# Patient Record
Sex: Female | Born: 1975 | Race: Black or African American | Hispanic: No | Marital: Single | Smoking: Never smoker
Health system: Southern US, Community
[De-identification: ages and names within clinical notes are randomized; demographics above are authoritative.]

## PROBLEM LIST (undated history)

## (undated) DIAGNOSIS — Z789 Other specified health status: Secondary | ICD-10-CM

## (undated) HISTORY — DX: Other specified health status: Z78.9

## (undated) HISTORY — PX: NO PAST SURGERIES: SHX2092

---

## 2005-08-26 ENCOUNTER — Emergency Department (HOSPITAL_COMMUNITY): Admission: EM | Admit: 2005-08-26 | Discharge: 2005-08-26 | Payer: Self-pay | Admitting: Emergency Medicine

## 2005-09-05 ENCOUNTER — Emergency Department (HOSPITAL_COMMUNITY): Admission: EM | Admit: 2005-09-05 | Discharge: 2005-09-05 | Payer: Self-pay | Admitting: Family Medicine

## 2005-09-06 ENCOUNTER — Emergency Department (HOSPITAL_COMMUNITY): Admission: EM | Admit: 2005-09-06 | Discharge: 2005-09-06 | Payer: Self-pay | Admitting: Family Medicine

## 2010-04-11 ENCOUNTER — Emergency Department (HOSPITAL_COMMUNITY): Admission: EM | Admit: 2010-04-11 | Discharge: 2010-04-11 | Payer: Self-pay | Admitting: Emergency Medicine

## 2011-06-09 ENCOUNTER — Emergency Department (HOSPITAL_COMMUNITY)
Admission: EM | Admit: 2011-06-09 | Discharge: 2011-06-10 | Disposition: A | Discharge status: Home / Self Care | Payer: Commercial Managed Care - PPO | Attending: Emergency Medicine | Admitting: Emergency Medicine

## 2011-06-09 DIAGNOSIS — O99891 Other specified diseases and conditions complicating pregnancy: Secondary | ICD-10-CM | POA: Insufficient documentation

## 2011-06-09 DIAGNOSIS — J40 Bronchitis, not specified as acute or chronic: Secondary | ICD-10-CM | POA: Insufficient documentation

## 2011-06-09 DIAGNOSIS — B9789 Other viral agents as the cause of diseases classified elsewhere: Secondary | ICD-10-CM | POA: Insufficient documentation

## 2011-06-09 DIAGNOSIS — O210 Mild hyperemesis gravidarum: Secondary | ICD-10-CM | POA: Insufficient documentation

## 2011-06-09 DIAGNOSIS — R070 Pain in throat: Secondary | ICD-10-CM | POA: Insufficient documentation

## 2011-06-09 DIAGNOSIS — J3489 Other specified disorders of nose and nasal sinuses: Secondary | ICD-10-CM | POA: Insufficient documentation

## 2011-06-10 ENCOUNTER — Emergency Department (HOSPITAL_COMMUNITY): Payer: Commercial Managed Care - PPO

## 2011-06-10 ENCOUNTER — Encounter (HOSPITAL_COMMUNITY): Payer: Self-pay | Admitting: Radiology

## 2011-06-10 LAB — URINALYSIS, ROUTINE W REFLEX MICROSCOPIC
Bilirubin Urine: NEGATIVE
Glucose, UA: 250 mg/dL — AB
Ketones, ur: NEGATIVE mg/dL
Protein, ur: NEGATIVE mg/dL

## 2011-06-10 LAB — RAPID STREP SCREEN (MED CTR MEBANE ONLY): Streptococcus, Group A Screen (Direct): NEGATIVE

## 2011-06-10 LAB — URINE MICROSCOPIC-ADD ON

## 2011-06-10 IMAGING — CR DG CHEST 2V
2 series · 2 of 2 positions shown · non-contrast
Comparison: None.

CLINICAL DATA: Cough for few days.  5 months pregnant.  Double
shielded.

CHEST - 2 VIEW

[w chest pa]
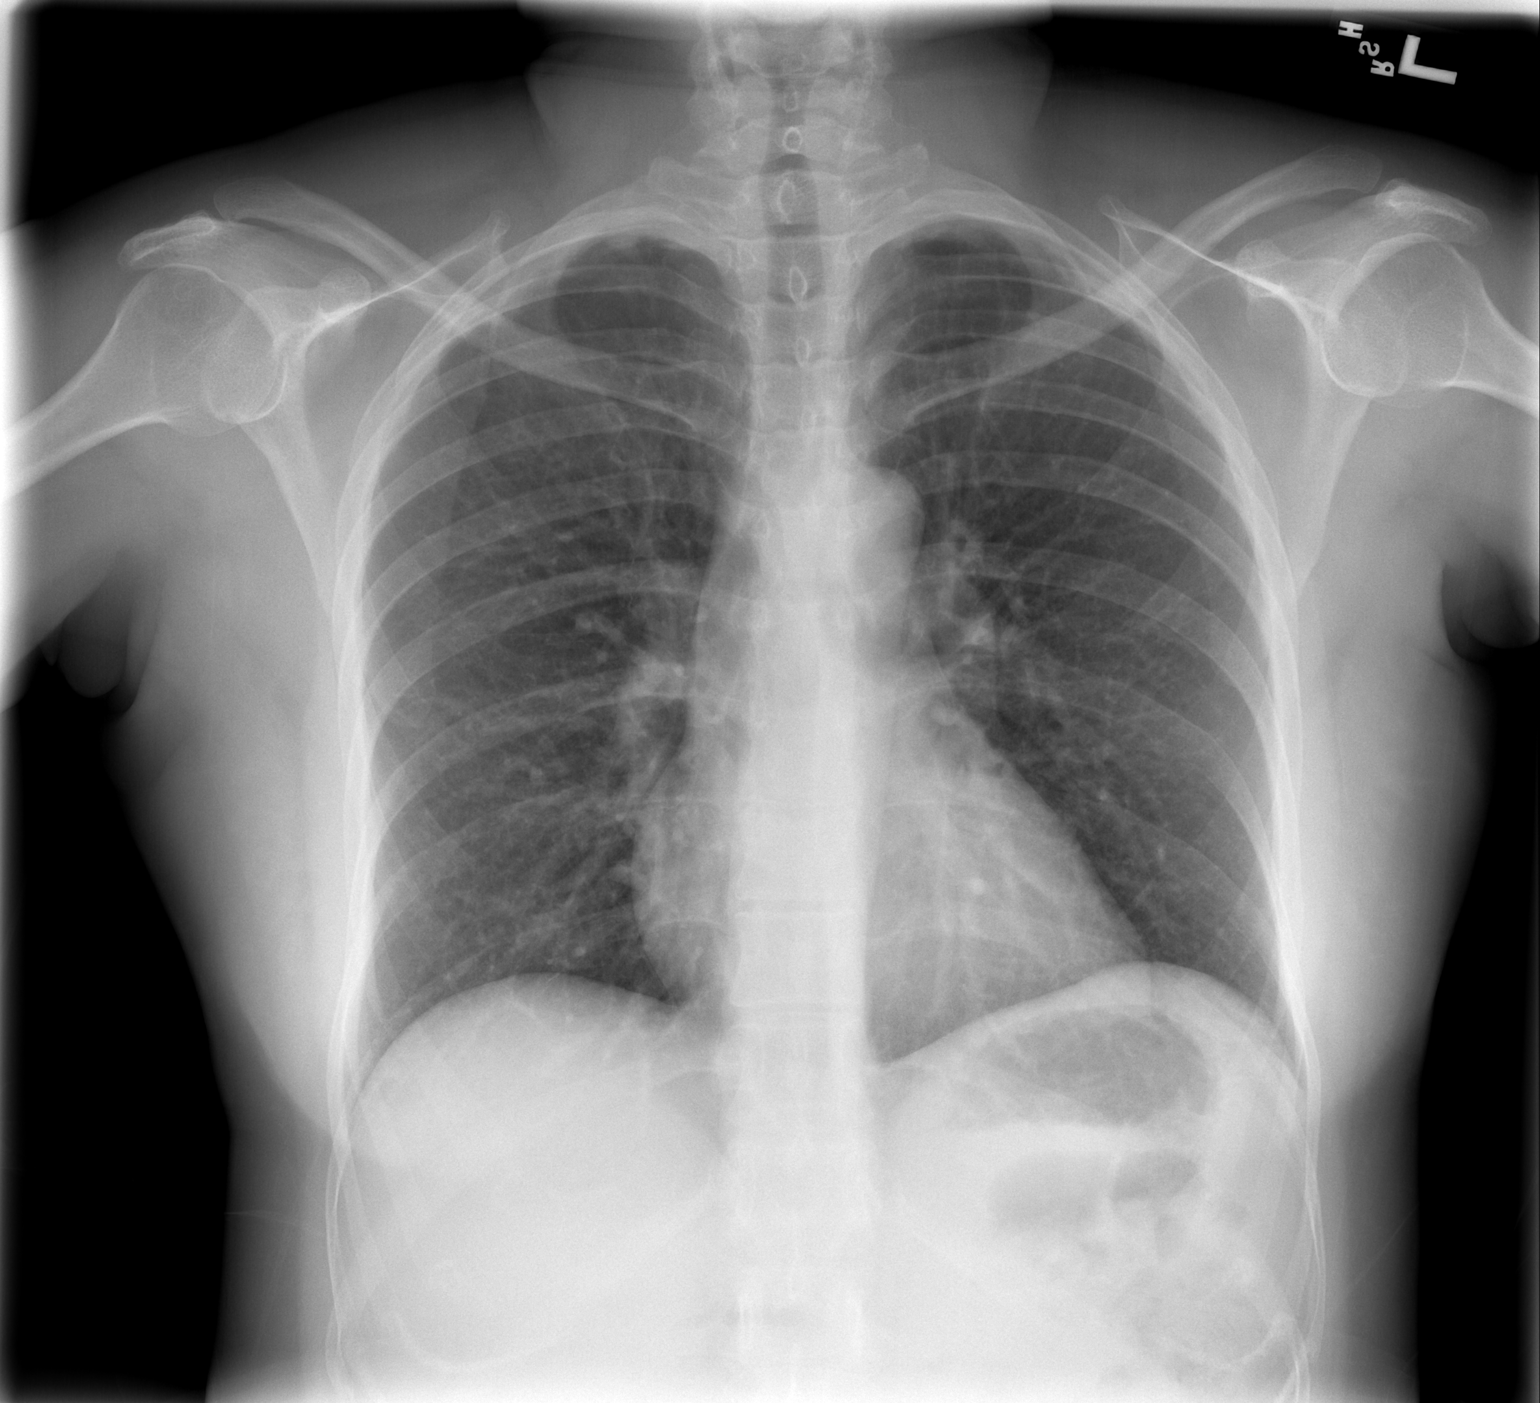

[w chest lat]
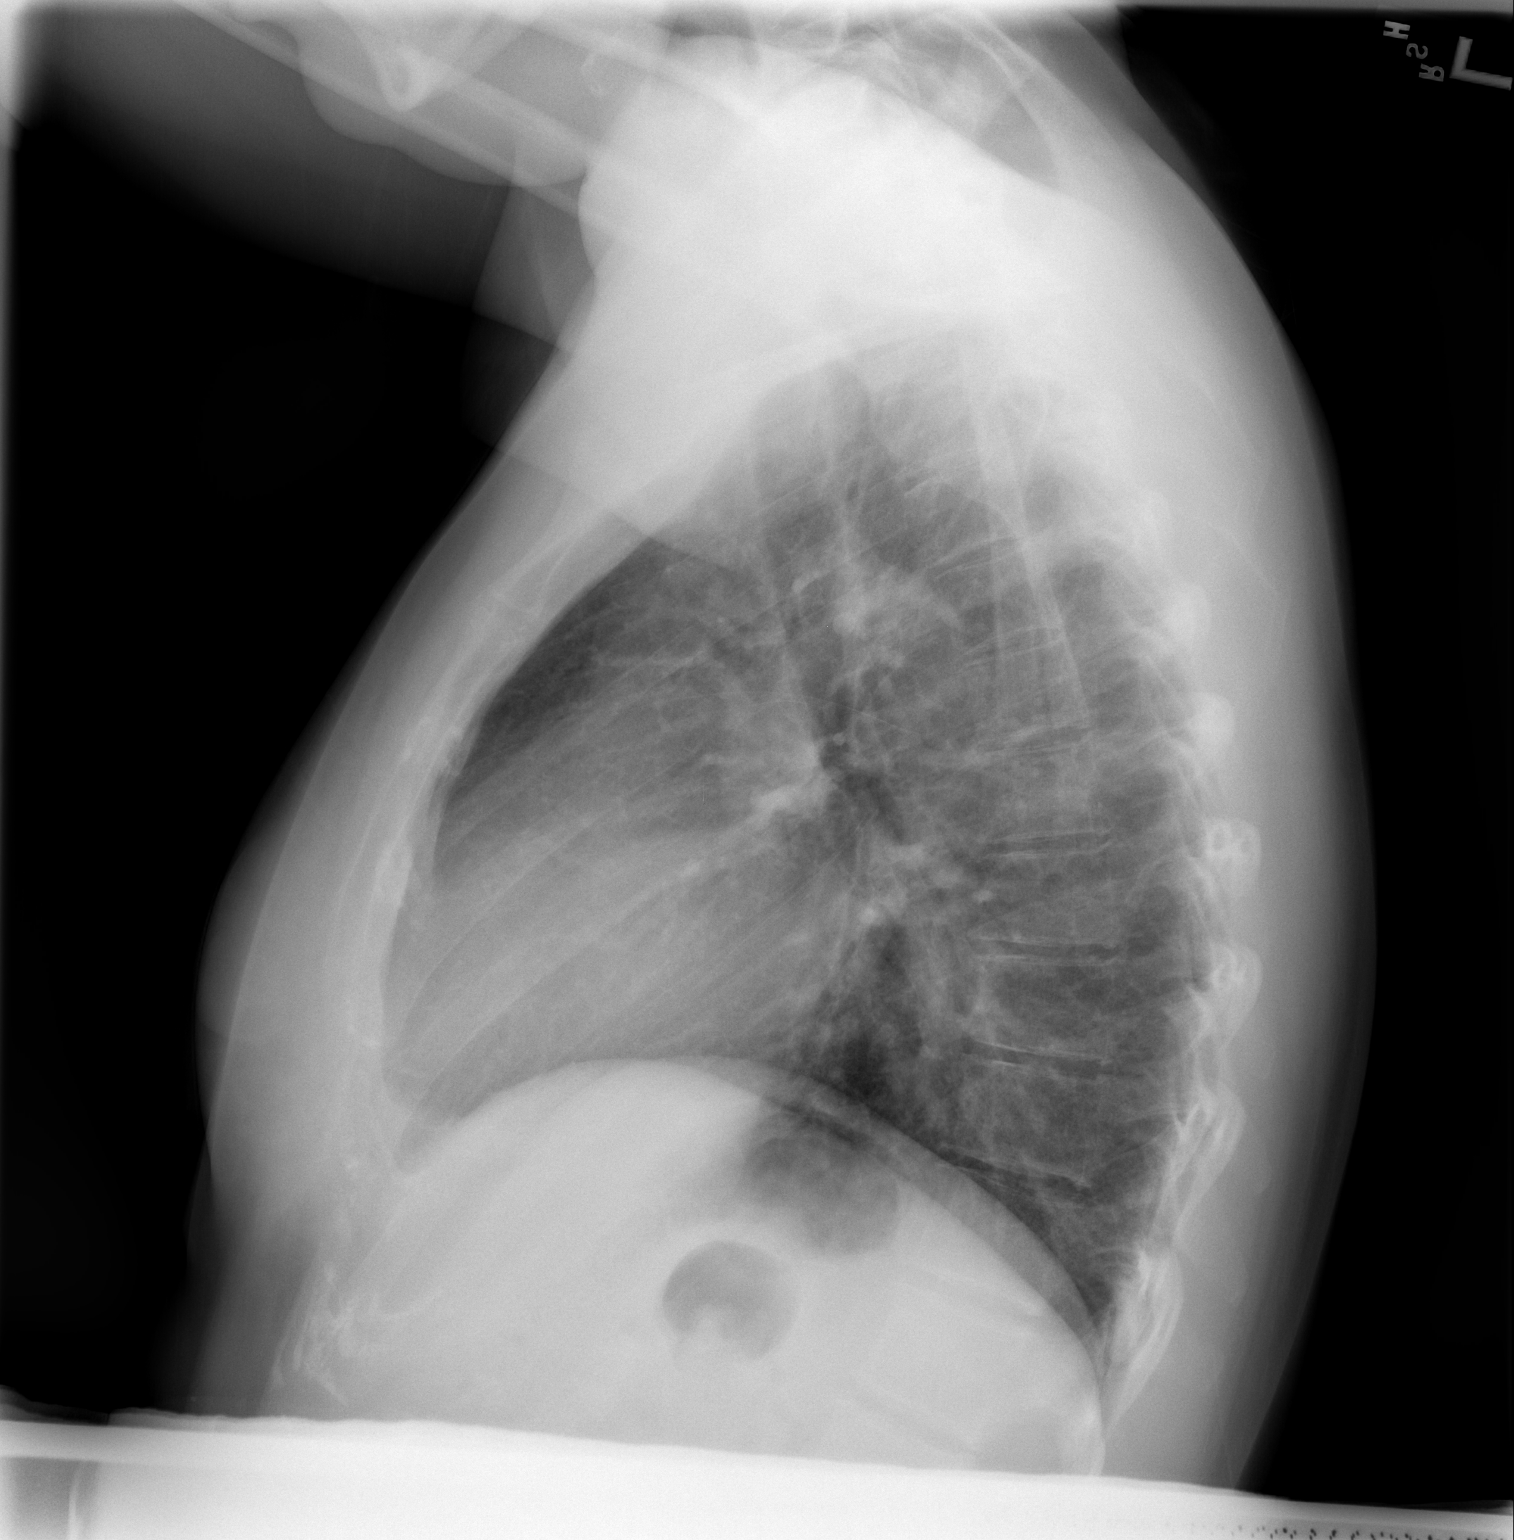

[2 of 2 positions shown; findings below may reference images not displayed]

FINDINGS: The heart size and pulmonary vascularity are normal. The
lungs appear clear and expanded without focal air space disease or
consolidation. No blunting of the costophrenic angles.
IMPRESSION: No evidence of active pulmonary disease.

## 2011-09-05 ENCOUNTER — Ambulatory Visit (INDEPENDENT_AMBULATORY_CARE_PROVIDER_SITE_OTHER): Payer: Medicaid Other | Admitting: *Deleted

## 2011-09-05 ENCOUNTER — Ambulatory Visit (HOSPITAL_COMMUNITY)
Admission: RE | Admit: 2011-09-05 | Discharge: 2011-09-05 | Disposition: A | Discharge status: Home / Self Care | Payer: Medicaid Other | Source: Ambulatory Visit | Attending: Obstetrics & Gynecology | Admitting: Obstetrics & Gynecology

## 2011-09-05 ENCOUNTER — Other Ambulatory Visit: Payer: Medicaid Other

## 2011-09-05 VITALS — BP 117/73 | Temp 97.1°F | Ht 64.0 in | Wt 155.9 lb

## 2011-09-05 DIAGNOSIS — Z1389 Encounter for screening for other disorder: Secondary | ICD-10-CM | POA: Insufficient documentation

## 2011-09-05 DIAGNOSIS — O09529 Supervision of elderly multigravida, unspecified trimester: Secondary | ICD-10-CM

## 2011-09-05 DIAGNOSIS — Z363 Encounter for antenatal screening for malformations: Secondary | ICD-10-CM | POA: Insufficient documentation

## 2011-09-05 DIAGNOSIS — Z3490 Encounter for supervision of normal pregnancy, unspecified, unspecified trimester: Secondary | ICD-10-CM

## 2011-09-05 DIAGNOSIS — Z3480 Encounter for supervision of other normal pregnancy, unspecified trimester: Secondary | ICD-10-CM

## 2011-09-05 DIAGNOSIS — O358XX0 Maternal care for other (suspected) fetal abnormality and damage, not applicable or unspecified: Secondary | ICD-10-CM | POA: Insufficient documentation

## 2011-09-05 DIAGNOSIS — O0930 Supervision of pregnancy with insufficient antenatal care, unspecified trimester: Secondary | ICD-10-CM

## 2011-09-05 DIAGNOSIS — IMO0002 Reserved for concepts with insufficient information to code with codable children: Secondary | ICD-10-CM | POA: Insufficient documentation

## 2011-09-05 DIAGNOSIS — Z23 Encounter for immunization: Secondary | ICD-10-CM

## 2011-09-05 IMAGING — US US OB DETAIL+14 WK
1 series · 12 of 28 positions shown · non-contrast
Comparison: none

[Series 1: us ob detail +14 wk · 12 of 53 slices shown]
[im 2/53]
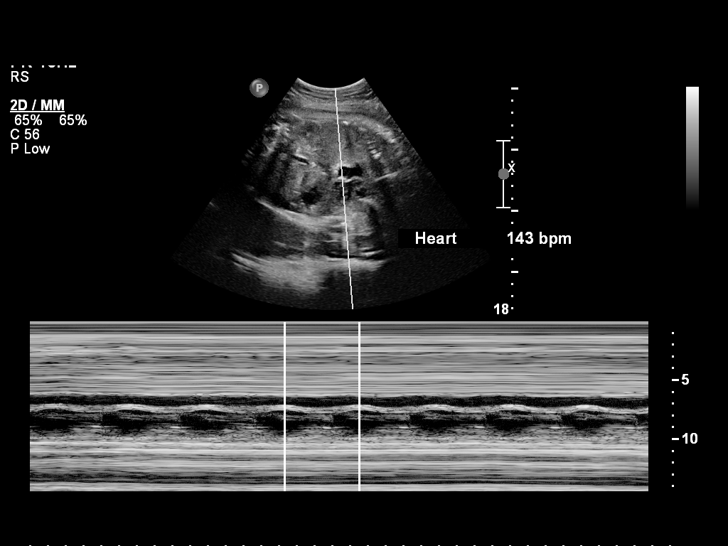
[im 6/53]
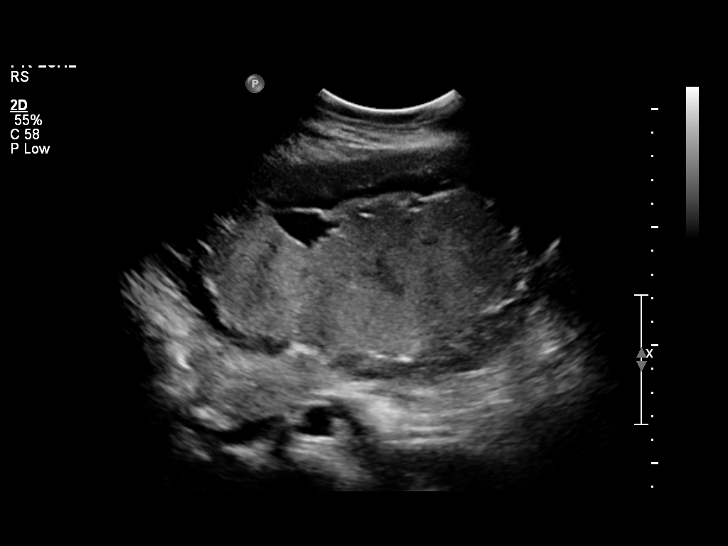
[im 10/53]
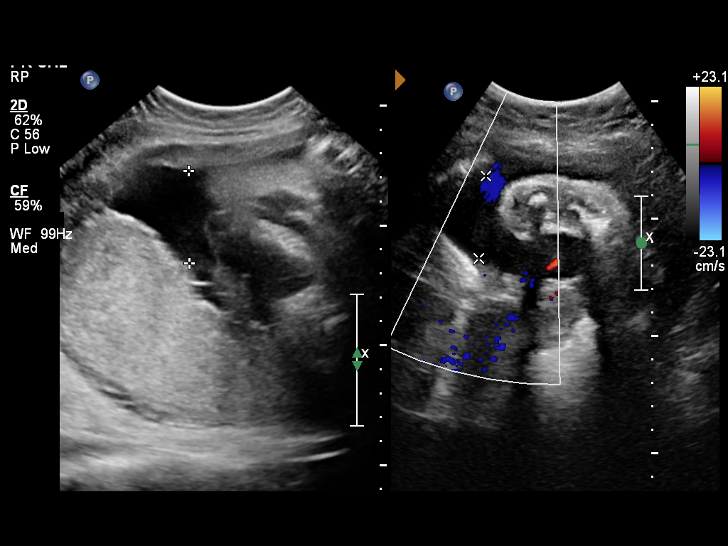
[im 16/53]
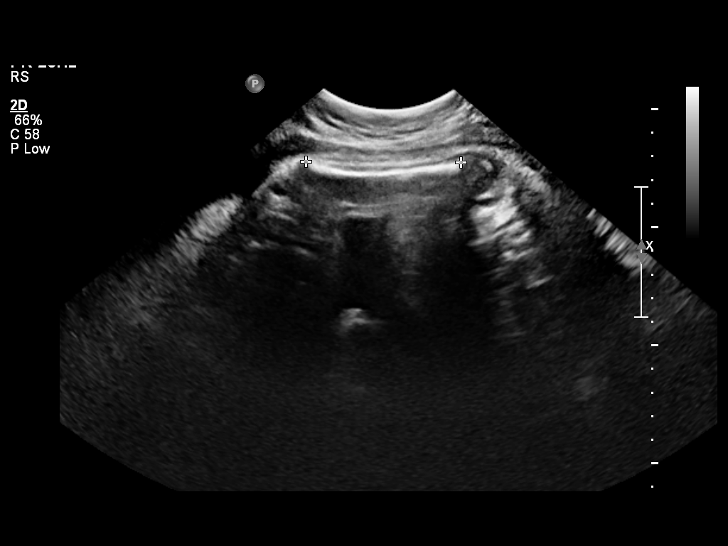
[im 20/53]
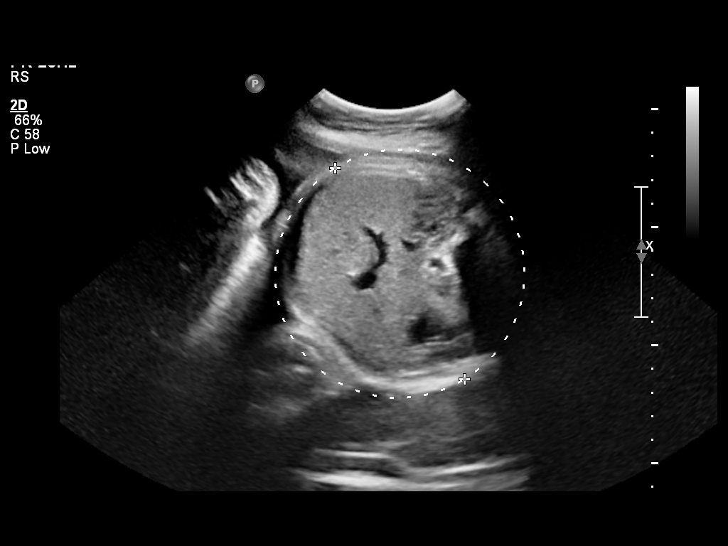
[im 24/53]
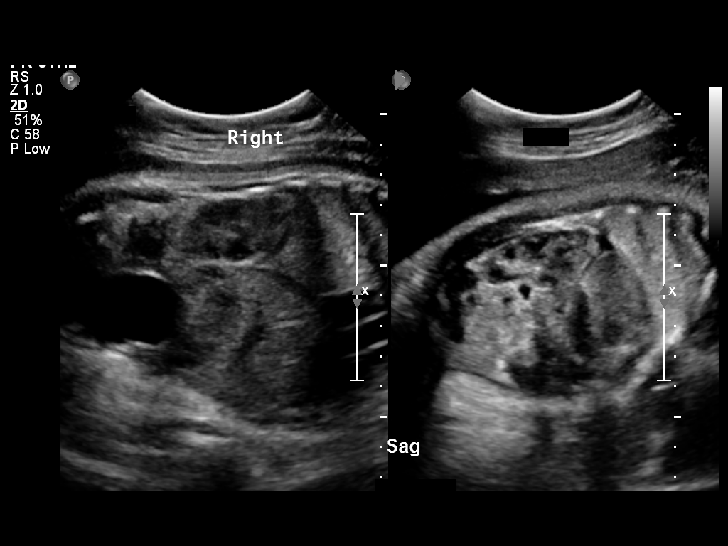
[im 29/53]
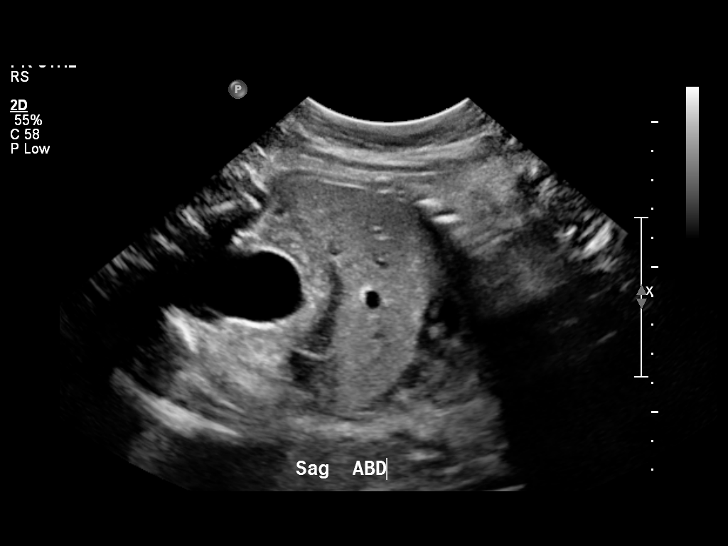
[im 33/53]
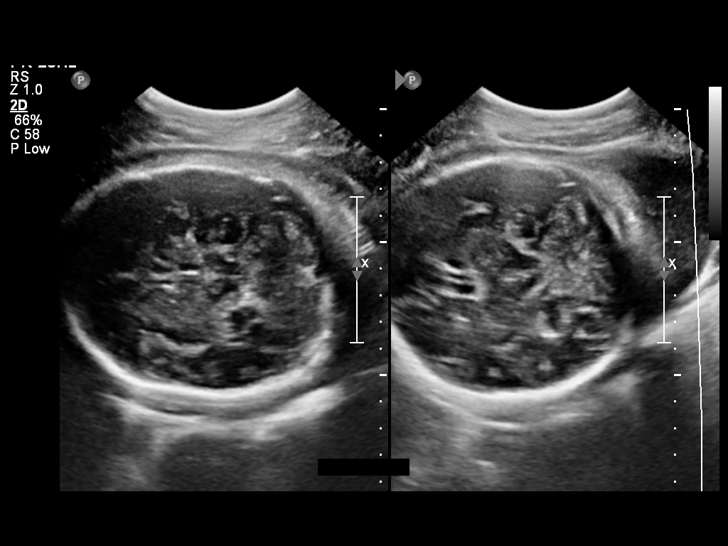
[im 37/53]
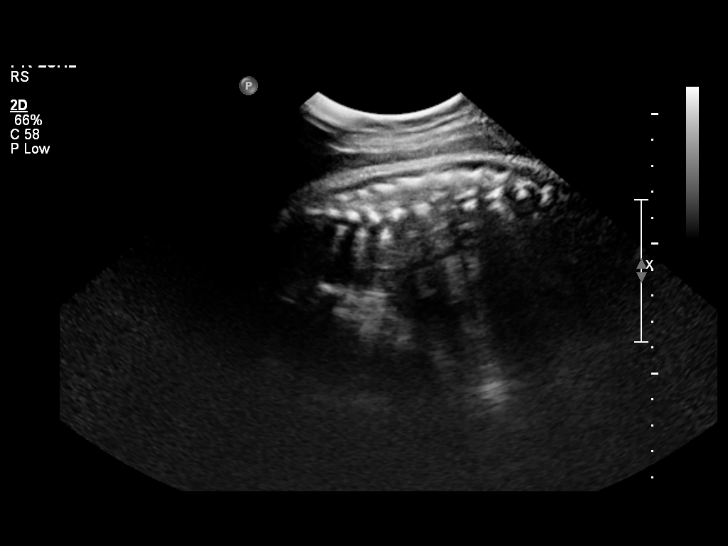
[im 43/53]
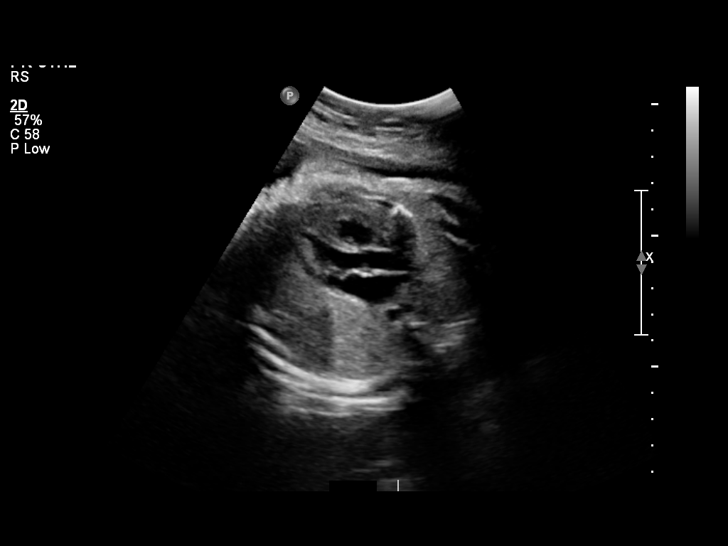
[im 47/53]
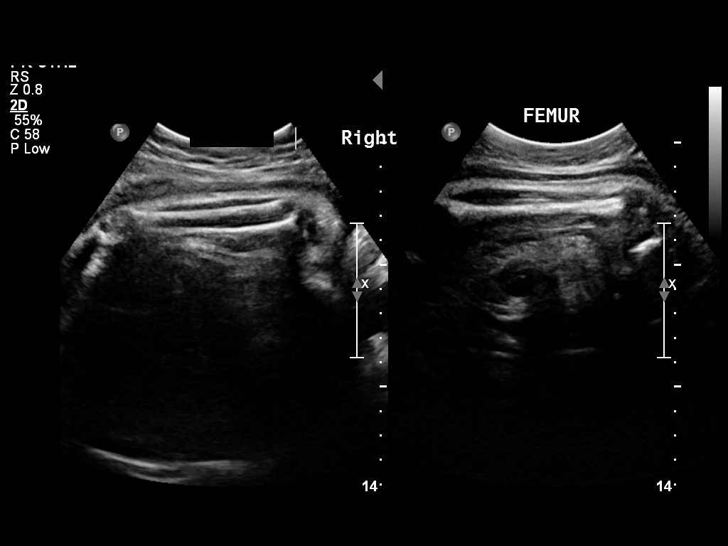
[im 51/53]
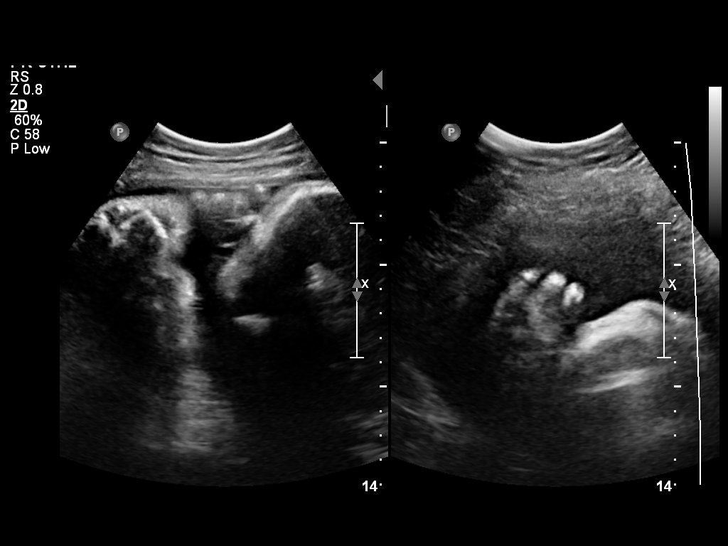

[12 of 28 positions shown; findings below may reference images not displayed]

OBSTETRICS REPORT
                      (Signed Final [DATE] [DATE])

 Order#:         [PHONE_NUMBER]_O
Procedures

 US OB DETAIL + 14 WK                                  76811.0
Indications

 Unsure of LMP;  Establish Gestational [AGE]
 Advanced maternal age (AMA), Multigravida
 Late to prenatal care
 Detailed fetal anatomic survey                        655.83 [78]
Fetal Evaluation

 Fetal Heart Rate:  143                          bpm
 Cardiac Activity:  Observed
 Presentation:      Cephalic
 Placenta:          Anterior, above cervical os
 P. Cord            Not well visualized
 Insertion:

 Amniotic Fluid
 AFI FV:      Subjectively within normal limits
 AFI Sum:     15.61   cm       58  %Tile     Larg Pckt:     4.2  cm
 RUQ:   3.84    cm   RLQ:    4.2    cm    LUQ:   4.05    cm   LLQ:    3.52   cm
Biometry

 BPD:     87.7  mm     G. Age:  35w 3d                CI:        71.04   70 - 86
                                                      FL/HC:      22.1   20.8 -

 HC:     331.5  mm     G. Age:  37w 6d       49  %    HC/AC:      1.03   0.92 -

 AC:       323  mm     G. Age:  36w 1d       48  %    FL/BPD:     83.7   71 - 87
 FL:      73.4  mm     G. Age:  37w 4d       69  %    FL/AC:      22.7   20 - 24
 HUM:     65.4  mm     G. Age:  38w 0d       92  %

 Est. FW:    [78]  gm    6 lb 10 oz      66  %
Gestational Age

 LMP:           34w 6d        Date:  [DATE]                 EDD:   [DATE]
 U/S Today:     36w 5d                                        EDD:   [DATE]
 Best:          36w 5d     Det. By:  U/S ([DATE])           EDD:   [DATE]
Genetic Sonogram - Trisomy 21 Screening

 Age:                                             34          Risk=1:   237
 Echogenic bowel:                                 No          LR :
 Hypoplastic/absent Nasal bone:                   No
 Choroid plexus cysts:                            No
 Structural anomalies (inc. cardiac):             No          LR :
 Hypoplastic / absent midphalanx 5th Digit:       No
 Short femur:                                     No          LR :
 Wide space [78] toes:                         No
 Short humerus:                                   No          LR :
 2-vessel umbilical cord:                         No
 Pyelectasis:                                     No          LR :
 Echogenic cardiac foci:                          No          LR :

 11 Of 11 Criteria Were Visualized and 0 Abnormal(s) Were Seen.
 Ultrasound Modified Risk for Fetal Down Syndrome = [DATE]
Anatomy

 Cranium:           Appears normal      Aortic Arch:       Not well
                                                           visualized
 Fetal Cavum:       Appears normal      Ductal Arch:       Not well
                                                           visualized
 Ventricles:        Appears normal      Diaphragm:         Appears normal
 Choroid Plexus:    Appears normal      Stomach:           Appears
                                                           normal, left
                                                           sided
 Cerebellum:        Appears normal      Abdomen:           Appears normal
 Posterior Fossa:   Appears normal      Abdominal Wall:    Appears nml
                                                           (cord insert,
                                                           abd wall)
 Nuchal Fold:       Not applicable      Cord Vessels:      Appears normal
                    (>20 wks GA)                           (3 vessel cord)
 Face:              Lips and orbits     Kidneys:           Appear normal
                    appear normal
 Heart:             Appears normal      Bladder:           Appears normal
                    (4 chamber &
                    axis)
 RVOT:              Appears normal      Spine:             Appears normal
 LVOT:              Appears normal      Limbs:             Four extremities
                                                           seen

 Other:     Female gender. Technically difficult due to advanced GA
            and fetal position.
Cervix Uterus Adnexa

 Cervix:       Not visualized (advanced GA >34 wks)

 Adnexa:     No abnormality visualized.
Impression

  Single living intrauterine gestation in cephalic presentation
 with fetal indices and normal visualized anatomy.  The
 sonographic EGA today is [78].  No sonographic markers
 for aneuploidy visualized;  see above for US modified
 Trisomy 21 risk calculation.
 questions or concerns.

## 2011-09-05 MED ORDER — INFLUENZA VIRUS VACC SPLIT PF IM SUSP: 0.5000 mL | Freq: Once | INTRAMUSCULAR | Status: AC

## 2011-09-05 NOTE — Progress Notes (Signed)
Pt in today for [facility] and Labs. Pt completed 1 hr gtt, lab draw due at 930. Pt also desires flu vaccine.  All first visit items given to patient. Medicaid home form completed. Pt will have ultrasound today at 1000. Pt is having some mild swelling at times in feet. Also has some pelvic pressure at times, no contractions, or bleeding.  Pt to return on 09/06/11 for MD visit. Flu vaccine given.

## 2011-09-05 NOTE — Progress Notes (Signed)
Lab tech did not draw all blood that was ordered. She only drew the 1 hr gtt. Need to advise patient tomorrow that additional blood draw is needed.

## 2011-09-06 ENCOUNTER — Ambulatory Visit (INDEPENDENT_AMBULATORY_CARE_PROVIDER_SITE_OTHER): Payer: Medicaid Other | Admitting: Advanced Practice Midwife

## 2011-09-06 ENCOUNTER — Other Ambulatory Visit: Payer: Self-pay | Admitting: Obstetrics and Gynecology

## 2011-09-06 DIAGNOSIS — O09529 Supervision of elderly multigravida, unspecified trimester: Secondary | ICD-10-CM

## 2011-09-06 DIAGNOSIS — IMO0002 Reserved for concepts with insufficient information to code with codable children: Secondary | ICD-10-CM

## 2011-09-06 DIAGNOSIS — Z3480 Encounter for supervision of other normal pregnancy, unspecified trimester: Secondary | ICD-10-CM

## 2011-09-06 LAB — POCT URINALYSIS DIP (DEVICE)
Glucose, UA: NEGATIVE mg/dL
Nitrite: NEGATIVE
Specific Gravity, Urine: 1.02 (ref 1.005–1.030)
Urobilinogen, UA: 1 mg/dL (ref 0.0–1.0)

## 2011-09-06 LAB — STREP B DNA PROBE: GBS: POSITIVE

## 2011-09-06 LAB — GLUCOSE TOLERANCE, 1 HOUR: Glucose, 1 Hour GTT: 164 mg/dL — ABNORMAL HIGH (ref 70–140)

## 2011-09-06 NOTE — Progress Notes (Signed)
Pressure: pelvic Pain: none 

## 2011-09-06 NOTE — Progress Notes (Signed)
  Subjective:    Olivia Abbott is a 35 y.o. female being seen today for her obstetrical visit. She is at [redacted]w[redacted]d gestation. Patient reports no complaints. Fetal movement: normal. No prenatal care. Had u/s and 1 hour GTT yesterday. U/S showed normal anatomy, EGA 36.5.   Menstrual History: OB History    Grav Para Term Preterm Abortions TAB SAB Ect Mult Living   3 2 2  0 0 0 0 0 0 2       No LMP recorded. Patient is pregnant.    Medical and OB history rev'd. 3 prior uncomplicated vaginal births.   Review of Systems Pertinent items are noted in HPI.   Objective:    BP 107/68  Pulse 107  Temp 96.7 F (35.9 C)  Wt 154 lb 11.2 oz (70.171 kg) FHT:  145 BPM  Uterine Size: 31 cm  Presentation: cephalic     Assessment:    Pregnancy 36 and 6/7 weeks   Plan:    28-week labs reviewed, pending Prenatal panel, HIV, GBS and GC/CT done today Rev'd precautions F/U 1 week

## 2011-09-06 NOTE — Progress Notes (Signed)
Addended by: Archie Patten on: 09/06/2011 11:13 AM   Modules accepted: Orders

## 2011-09-06 NOTE — Patient Instructions (Addendum)
  Pregnancy - Third Trimester The third trimester begins at the 28th week of pregnancy and ends at birth. It is important to follow your doctor's instructions. HOME CARE  Keep your doctor's appointments.   Do not smoke.   Do not drink alcohol or use drugs.   Only take medicine the doctor tells you to take.   Take prenatal vitamins as directed. The vitamin should contain 1 milligram of folic acid.   Exercise.   Eat healthy foods. Eat regular, well-balanced meals.   You can have sex (intercourse) if there are no other problems with the pregnancy.   Do not use hot tubs, steam rooms, or saunas.   Wear a seat belt while driving.   Avoid raw meat, uncooked cheese, and litter boxes and soil used by cats.   Rest with your legs raised (elevated).   Make a list of emergency phone numbers. Keep this list with you.   Arrange for help when you come back home after delivering the baby.   Make a trial run to the hospital.   Take prenatal classes.   Prepare the baby's nursery.   Do not travel out of the city. If you absolutely have to, get permission from your doctor first.   Wear flat shoes. Do not wear high heels.  GET HELP IF:  You have any concerns or worries during your pregnancy.  GET HELP RIGHT AWAY IF:  You have a temperature by mouth above 100.4, not controlled by medicine.   You have not felt the baby move for more than 1 hour. If you think the baby is not moving as much as normal, eat something with sugar in it or lie down on your left side for an hour. The baby should move at least 4 to 5 times per hour.   Fluid is coming from the vagina.   Blood is coming from the vagina. Light spotting is common, especially after sex (intercourse).   You have belly (abdominal) pain.   You have a bad smelling fluid (discharge) coming from the vagina. The fluid changes from clear to white.   You still feel sick to your stomach (nauseous).   You throw up (vomit) more than 24  hours.   You have the chills.   You have shortness of breath.   You have a burning feeling when you pee (urinate).   You loose or gain more than 2 pounds (0.9 kilograms) of weight over a weeks time, or as suggested by your doctor.   Your face, hands, feet, or legs get puffy (swell).   You have a bad headache that will not go away.   You start to have problems seeing (blurry or double vision).   You fall, are in a car accident, or have any kind of trauma.   There is mental or physical violence at home.  MAKE SURE YOU:   Understand these instructions.   Will watch your condition.   Will get help right away if you are not doing well or get worse.  Document Released: 02/14/2010  [facility] Patient Information 2011 [place], [place].

## 2011-09-07 LAB — OBSTETRIC PANEL
Basophils Relative: 0 % (ref 0–1)
Hemoglobin: 10.9 g/dL — ABNORMAL LOW (ref 12.0–15.0)
Hepatitis B Surface Ag: NEGATIVE
Lymphocytes Relative: 19 % (ref 12–46)
MCHC: 32.8 g/dL (ref 30.0–36.0)
Monocytes Relative: 10 % (ref 3–12)
Neutro Abs: 6.2 10*3/uL (ref 1.7–7.7)
Neutrophils Relative %: 70 % (ref 43–77)
RBC: 3.77 MIL/uL — ABNORMAL LOW (ref 3.87–5.11)
Rh Type: POSITIVE
WBC: 8.9 10*3/uL (ref 4.0–10.5)

## 2011-09-08 LAB — HEMOGLOBINOPATHY EVALUATION
Hemoglobin Other: 0 % (ref 0.0–0.0)
Hgb A2 Quant: 2.7 % (ref 2.2–3.2)
Hgb F Quant: 0 % (ref 0.0–2.0)

## 2011-09-09 ENCOUNTER — Encounter: Payer: Self-pay | Admitting: Advanced Practice Midwife

## 2011-09-09 DIAGNOSIS — O99820 Streptococcus B carrier state complicating pregnancy: Secondary | ICD-10-CM | POA: Insufficient documentation

## 2011-09-12 LAB — CULTURE, BETA STREP (GROUP B ONLY)

## 2011-09-13 ENCOUNTER — Ambulatory Visit (INDEPENDENT_AMBULATORY_CARE_PROVIDER_SITE_OTHER): Payer: Medicaid Other | Admitting: Family Medicine

## 2011-09-13 DIAGNOSIS — IMO0002 Reserved for concepts with insufficient information to code with codable children: Secondary | ICD-10-CM

## 2011-09-13 DIAGNOSIS — O0930 Supervision of pregnancy with insufficient antenatal care, unspecified trimester: Secondary | ICD-10-CM

## 2011-09-13 DIAGNOSIS — K219 Gastro-esophageal reflux disease without esophagitis: Secondary | ICD-10-CM

## 2011-09-13 DIAGNOSIS — O09529 Supervision of elderly multigravida, unspecified trimester: Secondary | ICD-10-CM

## 2011-09-13 LAB — POCT URINALYSIS DIP (DEVICE)
Bilirubin Urine: NEGATIVE
Glucose, UA: NEGATIVE mg/dL
Hgb urine dipstick: NEGATIVE
Specific Gravity, Urine: 1.02 (ref 1.005–1.030)

## 2011-09-13 MED ORDER — FAMOTIDINE 20 MG PO TABS: 20.0000 mg | ORAL_TABLET | Freq: Two times a day (BID) | ORAL | Status: DC

## 2011-09-13 NOTE — Progress Notes (Signed)
Patient seen and examined with PA-S Rochel Brome. Agree with note.

## 2011-09-13 NOTE — Patient Instructions (Signed)
Kegel Exercise Information provided. Follow-up in one week.

## 2011-09-13 NOTE — Progress Notes (Signed)
Subjective:    Olivia Abbott is a 35 y.o. female being seen today for her obstetrical visit. She is at [redacted]w[redacted]d gestation. Patient reports heartburn and vomiting.  She also states she is having some stress incontinence. Fetal movement: normal.  Menstrual History: OB History    Grav Para Term Preterm Abortions TAB SAB Ect Mult Living   3 2 2  0 0 0 0 0 0 2     No LMP recorded. Patient is pregnant.   Review of Systems Pertinent items are noted in HPI.   Objective:    BP 122/71  Temp 97 F (36.1 C)  Wt 71.079 kg (156 lb 11.2 oz) FHT: 130 BPM  Uterine Size: 37 cm  Presentations: cephalic     Assessment:    Pregnancy 37 and 6/7 weeks   Plan:   Plans for delivery: Vaginal anticipated Beta strep culture: done, discussed, results reviewed Follow up in 1 Week.   Patient prescribed PPI for heartburn. Given materials about [place] exercises to help treat urinary incontinence.

## 2011-09-13 NOTE — Progress Notes (Signed)
Pulse 90, vaginal discharge is thin white.

## 2011-09-20 ENCOUNTER — Ambulatory Visit (INDEPENDENT_AMBULATORY_CARE_PROVIDER_SITE_OTHER): Payer: Medicaid Other | Admitting: Obstetrics and Gynecology

## 2011-09-20 ENCOUNTER — Other Ambulatory Visit: Payer: Self-pay

## 2011-09-20 DIAGNOSIS — O09529 Supervision of elderly multigravida, unspecified trimester: Secondary | ICD-10-CM

## 2011-09-20 DIAGNOSIS — O09899 Supervision of other high risk pregnancies, unspecified trimester: Secondary | ICD-10-CM

## 2011-09-20 DIAGNOSIS — O99820 Streptococcus B carrier state complicating pregnancy: Secondary | ICD-10-CM

## 2011-09-20 DIAGNOSIS — Z22330 Carrier of Group B streptococcus: Secondary | ICD-10-CM

## 2011-09-20 DIAGNOSIS — IMO0002 Reserved for concepts with insufficient information to code with codable children: Secondary | ICD-10-CM

## 2011-09-20 LAB — POCT URINALYSIS DIP (DEVICE)
Bilirubin Urine: NEGATIVE
Ketones, ur: NEGATIVE mg/dL
Leukocytes, UA: NEGATIVE
pH: 8 (ref 5.0–8.0)

## 2011-09-20 NOTE — Progress Notes (Signed)
Routine PNV. Doing well. Plans Depo. Questions re GBS carriage -- reviewed that and s/sx labor.  EFW 7#.

## 2011-09-20 NOTE — Patient Instructions (Signed)
Group B Strep (GBS) Carrier State in a Pregnant Woman Group B Strep (GBS, carrier state) is a common infection found in pregnant women. It does not cause any symptoms. It can lead to a serious bacterial infection in a pregnant woman's baby. GBS is not a sexually transmitted disease and is not the same streptococcus bacteria that causes strep throat, which is Group A streptococcus.  CAUSES This carrier state is caused by bacteria called Group B Streptococcus or "Group B Strep (GBS)." GBS is found in the intestinal, reproductive and urinary tract. It can also be found in the female genital tract, most often in the vagina and rectal areas.  SYMPTOMS In pregnancy, GBS can be in the following places:  Genital tract without symptoms.   Rectum without symptoms.   Urine with or without symptoms (asymptomatic bacteriuria).   Symptoms can include pain, frequency, urgency and blood with urination (cystitis).  Having the bacteria in these places can lead to:  Early (premature) labor and delivery.   Prolonged rupture of the membranes.   GBS infection in the newborn.   Infection in the following places in the pregnant woman:   Bladder.  Kidneys (pyelonephritis).   Bag of waters or placenta (chorioamnionitis).   Lungs (pneumonia).   Bloodstream (septicemia).  Uterus (endometritis) - after delivery.   Spinal cord (meningitis).   DIAGNOSIS Diagnosis of GBS infection is made by screening tests done in pregnancy. Tests (cultures) are done on the vagina, rectum and urine for the bacteria. Cultures to detect the bacteria are usually done if you have any of the problems listed under the SYMPTOMS section above. TREATMENT Medicines that kill germs (antibiotics) are given if there is GBS infection in the urine. Antibiotics are given during labor to prevent GBS infection of the newborn. Antibiotics are also given if:  GBS is found in a culture from the vagina, rectum, or urine.   Infection of the  membranes (amnionitis) is suspected. Antibiotics are given that will cover all forms of bacteria including GBS.   Labor begins or there is rupture of the membranes before 37 weeks of the pregnancy and there is a great risk of delivering the baby.   There is a past history of an infant born with GBS infection.   The culture status is unknown (culture not performed or result not available) and there is:   Fever during labor.   Pre-term labor (less than 37 weeks of gestation).   Prolonged rupture of membranes (18 hours or more)  Treatment of the mother during labor is not recommended when:  There was a positive culture for GBS in a past pregnancy, but no actual infection developed. Sometimes, bacteria will be found on the surface of tissue but will not damage the tissue and cause a problem if your immune system prevents infection.   A planned Cesarean Section is done and there is no labor or ruptured membranes. This is true even if the mother tested positive for GBS.   There is a negative culture for GBS screening during the pregnancy, regardless of the risk factors during labor.  The newborn baby will receive antibiotics if the baby tested positive for GBS or has signs and symptoms that suggest GBS infection is present. It is not recommended to routinely give antibiotics to infants whose mothers received antibiotic treatment during labor. HOME CARE INSTRUCTIONS  Take all antibiotics as prescribed by your caregiver.   Continue with prenatal visits and care.   Return for follow-up appointments and cultures.  Follow your caregiver's instructions. Take all the medications given for you and your baby.   Watch the baby closely for fever, poor appetite, listlessness/tiredness, stiff body or neck, trouble breathing and convulsions.  SEEK MEDICAL CARE IF:  Pain with urination.   Frequent urination.   Blood in the urine.  SEEK IMMEDIATE MEDICAL CARE IF YOU DEVELOP:  An oral temperature  of 100 or higher, or as directed by your caregiver.   Pain in the back, side, or uterus.   Chills.   Abdominal swelling (distension) or pain.   Pre-term labor (less than 37 weeks of gestation).   Repeated vomiting and diarrhea.   Trouble breathing.   Confusion.   Stiff body or neck.  SEEK IMMEDIATE MEDICAL CARE FOR THE BABY IF THE BABY HAS:  An oral temperature of 100 or 101 rectally or higher, or as directed by your caregiver.   Poor appetite.   Lack of action or activity.   Stiff body or neck.   Trouble breathing.   Convulsions.  MAKE SURE YOU:   Understand these instructions.   Will watch your condition.   Will get help right away if you are not doing well or get worse.  Document Released: 02/27/2008 Document Re-Released: 11/02/2008 [facility] Patient Information 2011 [place], [place].

## 2011-09-20 NOTE — Progress Notes (Signed)
Edema: legs, ankles Pain: pelvic

## 2011-09-27 ENCOUNTER — Telehealth (HOSPITAL_COMMUNITY): Payer: Self-pay | Admitting: *Deleted

## 2011-09-27 ENCOUNTER — Encounter (HOSPITAL_COMMUNITY): Payer: Self-pay | Admitting: *Deleted

## 2011-09-27 ENCOUNTER — Ambulatory Visit (INDEPENDENT_AMBULATORY_CARE_PROVIDER_SITE_OTHER): Payer: Medicaid Other | Admitting: Obstetrics and Gynecology

## 2011-09-27 DIAGNOSIS — O09529 Supervision of elderly multigravida, unspecified trimester: Secondary | ICD-10-CM

## 2011-09-27 DIAGNOSIS — O0930 Supervision of pregnancy with insufficient antenatal care, unspecified trimester: Secondary | ICD-10-CM

## 2011-09-27 LAB — POCT URINALYSIS DIP (DEVICE)
Ketones, ur: NEGATIVE mg/dL
Protein, ur: NEGATIVE mg/dL
Specific Gravity, Urine: 1.02 (ref 1.005–1.030)
pH: 8.5 — ABNORMAL HIGH (ref 5.0–8.0)

## 2011-09-27 NOTE — Progress Notes (Signed)
P=88, C/o edema feet /legs,c/o pelvic pressure, c/o headaches everyday started  09/26/11 =7- not taking anything for relief- headache is intermittent , denies cough/congestion

## 2011-09-27 NOTE — Progress Notes (Signed)
The patient 35 year old gravida 3 para 2002 with the youngest child age 92 and a due date tomorrow. She has had a completely normal pregnancy. We will schedule her for induction if she has not gone into labor on the first available induction.. After next Thursday. She will also be scheduled for an NST on Friday 1 day after her due date followed by another NST and a biophysical profile either Monday or Tuesday of next week. We'll give her a followup appointment next Wednesday at which time she will be 40 and 6 days.  The patient has no complaints has had no contractions and moderate pelvic pressure the baby is moving normally and frequently.

## 2011-09-27 NOTE — Telephone Encounter (Signed)
Preadmission screen  

## 2011-09-29 ENCOUNTER — Ambulatory Visit (INDEPENDENT_AMBULATORY_CARE_PROVIDER_SITE_OTHER): Payer: Medicaid Other | Admitting: *Deleted

## 2011-09-29 VITALS — BP 98/62 | Wt 156.0 lb

## 2011-09-29 DIAGNOSIS — O480 Post-term pregnancy: Secondary | ICD-10-CM

## 2011-09-29 NOTE — Progress Notes (Signed)
P = 113         NST only today

## 2011-09-30 NOTE — Progress Notes (Signed)
NST 10/26 reviewed: reactive, no decels

## 2011-10-02 ENCOUNTER — Ambulatory Visit (INDEPENDENT_AMBULATORY_CARE_PROVIDER_SITE_OTHER): Payer: Medicaid Other | Admitting: *Deleted

## 2011-10-02 VITALS — BP 100/64

## 2011-10-02 DIAGNOSIS — O480 Post-term pregnancy: Secondary | ICD-10-CM

## 2011-10-02 NOTE — Progress Notes (Signed)
P = 113   NST/AFI only today.  Pt is scheduled for IOL on 10/06/11.

## 2011-10-06 ENCOUNTER — Encounter (HOSPITAL_COMMUNITY): Payer: Self-pay

## 2011-10-06 ENCOUNTER — Inpatient Hospital Stay (HOSPITAL_COMMUNITY)
Admission: RE | Admit: 2011-10-06 | Discharge: 2011-10-11 | DRG: 774 | Disposition: A | Discharge status: Home / Self Care | Payer: Commercial Managed Care - PPO | Source: Ambulatory Visit | Attending: Obstetrics & Gynecology | Admitting: Obstetrics & Gynecology

## 2011-10-06 DIAGNOSIS — O09899 Supervision of other high risk pregnancies, unspecified trimester: Secondary | ICD-10-CM

## 2011-10-06 DIAGNOSIS — O480 Post-term pregnancy: Principal | ICD-10-CM | POA: Diagnosis present

## 2011-10-06 DIAGNOSIS — O99892 Other specified diseases and conditions complicating childbirth: Secondary | ICD-10-CM | POA: Diagnosis present

## 2011-10-06 DIAGNOSIS — O365990 Maternal care for other known or suspected poor fetal growth, unspecified trimester, not applicable or unspecified: Secondary | ICD-10-CM | POA: Diagnosis present

## 2011-10-06 DIAGNOSIS — IMO0002 Reserved for concepts with insufficient information to code with codable children: Secondary | ICD-10-CM

## 2011-10-06 DIAGNOSIS — O99820 Streptococcus B carrier state complicating pregnancy: Secondary | ICD-10-CM

## 2011-10-06 DIAGNOSIS — O0930 Supervision of pregnancy with insufficient antenatal care, unspecified trimester: Secondary | ICD-10-CM

## 2011-10-06 DIAGNOSIS — O09529 Supervision of elderly multigravida, unspecified trimester: Secondary | ICD-10-CM | POA: Diagnosis present

## 2011-10-06 DIAGNOSIS — Z22330 Carrier of Group B streptococcus: Secondary | ICD-10-CM

## 2011-10-06 LAB — CBC
HCT: 33.7 % — ABNORMAL LOW (ref 36.0–46.0)
Hemoglobin: 11.3 g/dL — ABNORMAL LOW (ref 12.0–15.0)
MCH: 29.1 pg (ref 26.0–34.0)
RBC: 3.88 MIL/uL (ref 3.87–5.11)

## 2011-10-06 LAB — HEMOGLOBIN A1C
Hgb A1c MFr Bld: 5.5 % (ref <5.7)
Mean Plasma Glucose: 111 mg/dL (ref <117)

## 2011-10-06 MED ORDER — PENICILLIN G POTASSIUM 5000000 UNITS IJ SOLR
2.5000 10*6.[IU] | INTRAVENOUS | Status: DC
  Administered 2011-10-06 – 2011-10-08 (×15): 2.5 10*6.[IU] via INTRAVENOUS
  Filled 2011-10-06 (×17): qty 2.5

## 2011-10-06 MED ORDER — ZOLPIDEM TARTRATE 10 MG PO TABS: 10.0000 mg | ORAL_TABLET | Freq: Every evening | ORAL | Status: DC | PRN

## 2011-10-06 MED ORDER — IBUPROFEN 600 MG PO TABS: 600.0000 mg | ORAL_TABLET | Freq: Four times a day (QID) | ORAL | Status: DC | PRN

## 2011-10-06 MED ORDER — MISOPROSTOL 25 MCG QUARTER TABLET
25.0000 ug | ORAL_TABLET | ORAL | Status: DC | PRN
  Administered 2011-10-06 (×2): 25 ug via VAGINAL
  Filled 2011-10-06 (×3): qty 0.25

## 2011-10-06 MED ORDER — ACETAMINOPHEN 325 MG PO TABS
650.0000 mg | ORAL_TABLET | ORAL | Status: DC | PRN
  Administered 2011-10-07: 650 mg via ORAL
  Filled 2011-10-06: qty 2

## 2011-10-06 MED ORDER — OXYTOCIN 20 UNITS IN LACTATED RINGERS INFUSION - SIMPLE
1.0000 m[IU]/min | INTRAVENOUS | Status: DC
  Administered 2011-10-06: 2 m[IU]/min via INTRAVENOUS
  Administered 2011-10-07: 20 m[IU]/min via INTRAVENOUS

## 2011-10-06 MED ORDER — LACTATED RINGERS IV SOLN
INTRAVENOUS | Status: DC
  Administered 2011-10-06 – 2011-10-07 (×3): via INTRAVENOUS
  Administered 2011-10-07: 1000 mL via INTRAVENOUS
  Administered 2011-10-08 (×5): via INTRAVENOUS

## 2011-10-06 MED ORDER — NALBUPHINE SYRINGE 5 MG/0.5 ML
5.0000 mg | INJECTION | INTRAMUSCULAR | Status: DC | PRN
  Administered 2011-10-07 – 2011-10-08 (×6): 5 mg via INTRAVENOUS
  Filled 2011-10-06 (×7): qty 0.5

## 2011-10-06 MED ORDER — OXYCODONE-ACETAMINOPHEN 5-325 MG PO TABS: 2.0000 | ORAL_TABLET | ORAL | Status: DC | PRN

## 2011-10-06 MED ORDER — HYDROXYZINE HCL 50 MG PO TABS
50.0000 mg | ORAL_TABLET | Freq: Four times a day (QID) | ORAL | Status: DC | PRN
  Administered 2011-10-07: 50 mg via ORAL
  Filled 2011-10-06: qty 1

## 2011-10-06 MED ORDER — PENICILLIN G POTASSIUM 5000000 UNITS IJ SOLR
5.0000 10*6.[IU] | Freq: Once | INTRAVENOUS | Status: DC
  Administered 2011-10-06: 5 10*6.[IU] via INTRAVENOUS
  Filled 2011-10-06: qty 5

## 2011-10-06 MED ORDER — TERBUTALINE SULFATE 1 MG/ML IJ SOLN: 0.2500 mg | Freq: Once | INTRAMUSCULAR | Status: AC | PRN

## 2011-10-06 MED ORDER — CITRIC ACID-SODIUM CITRATE 334-500 MG/5ML PO SOLN: 30.0000 mL | ORAL | Status: DC | PRN

## 2011-10-06 MED ORDER — ONDANSETRON HCL 4 MG/2ML IJ SOLN: 4.0000 mg | Freq: Four times a day (QID) | INTRAMUSCULAR | Status: DC | PRN

## 2011-10-06 MED ORDER — HYDROXYZINE HCL 50 MG/ML IM SOLN: 50.0000 mg | Freq: Four times a day (QID) | INTRAMUSCULAR | Status: DC | PRN

## 2011-10-06 MED ORDER — LACTATED RINGERS IV SOLN
500.0000 mL | INTRAVENOUS | Status: DC | PRN
  Administered 2011-10-06: 1000 mL via INTRAVENOUS
  Administered 2011-10-07: 300 mL via INTRAVENOUS
  Administered 2011-10-08: 1000 mL via INTRAVENOUS
  Administered 2011-10-08: 999 mL via INTRAVENOUS

## 2011-10-06 MED ORDER — LIDOCAINE HCL (PF) 1 % IJ SOLN
30.0000 mL | INTRAMUSCULAR | Status: DC | PRN
  Filled 2011-10-06: qty 30

## 2011-10-06 MED ORDER — OXYTOCIN BOLUS FROM INFUSION
500.0000 mL | Freq: Once | INTRAVENOUS | Status: DC
  Filled 2011-10-06: qty 500
  Filled 2011-10-06 (×2): qty 1000

## 2011-10-06 MED ORDER — OXYTOCIN 20 UNITS IN LACTATED RINGERS INFUSION - SIMPLE
125.0000 mL/h | Freq: Once | INTRAVENOUS | Status: AC
  Administered 2011-10-09: 125 mL/h via INTRAVENOUS

## 2011-10-06 MED ORDER — FLEET ENEMA 7-19 GM/118ML RE ENEM: 1.0000 | ENEMA | RECTAL | Status: DC | PRN

## 2011-10-06 NOTE — Progress Notes (Signed)
Olivia Abbott is a 35 y.o. G3P2002 at [redacted]w[redacted]d admitted for induction of labor due to Post dates.  Subjective: Pt up on birth ball, appears comfortable, but states contractions are getting stronger.    Objective: BP 121/65  Pulse 91  Temp(Src) 97.3 F (36.3 C) (Oral)  Resp 18  Ht 5\' 2"  (1.575 m)  Wt 70.761 kg (156 lb)  BMI 28.53 kg/m2      FHT:  FHR: 145  bpm, variability: moderate,  accelerations:  Present,  decelerations:  Absent - difficult to monitor at this time d/t position UC:   regular, every 2-3 minutes SVE:   Dilation: 1.5 Effacement (%): 70 Station: -3 Exam by:: J.Thornton, RN  Labs: Lab Results  Component Value Date   WBC 9.4 10/06/2011   HGB 11.3* 10/06/2011   HCT 33.7* 10/06/2011   MCV 86.9 10/06/2011   PLT 324 10/06/2011    Assessment / Plan: Induction of labor due to postterm,  progressing well on pitocin  Labor: Progressing on Pitocin, will continue to increase then AROM Preeclampsia:  n/a Fetal Wellbeing:  Category I Pain Control:  Labor support without medications I/D:  n/a Anticipated MOD:  NSVD  Keilin Gamboa 10/06/2011, 8:37 PM

## 2011-10-06 NOTE — Progress Notes (Signed)
Olivia Abbott is a 35 y.o. G3P2002 at [redacted]w[redacted]d.  Subjective: Little change in UC's. Coping well using comfort measures.  Objective: BP 121/65  Pulse 91  Temp(Src) 97.3 F (36.3 C) (Oral)  Resp 18  Ht 5\' 2"  (1.575 m)  Wt 70.761 kg (156 lb)  BMI 28.53 kg/m2      FHT:  FHR: 145 bpm, variability: moderate,  accelerations:  Present,  decelerations:  Absent UC:   regular, every 3-5 minutes SVE:   Dilation: 1.5 Effacement (%): 70 Station: -3 Exam by:: J.Thornton, RN  Labs: Lab Results  Component Value Date   WBC 9.4 10/06/2011   HGB 11.3* 10/06/2011   HCT 33.7* 10/06/2011   MCV 86.9 10/06/2011   PLT 324 10/06/2011    Assessment / Plan: Induction of labor due to postterm  Labor: Progressing on Pitocin, will continue to increase then AROM Preeclampsia:  NA Fetal Wellbeing:  Category I Pain Control:  Labor support without medications I/D:  n/a Anticipated MOD:  NSVD  Danitza Schoenfeldt 10/06/2011, 8:20 PM

## 2011-10-06 NOTE — Progress Notes (Signed)
Olivia Abbott is a 35 y.o. G3P2002 at [redacted]w[redacted]d admitted for induction of labor due to Post dates. Due date 09/28/11.  Subjective: Mild-mod cramping  Objective: BP 116/60  Pulse 89  Temp(Src) 97.4 F (36.3 C) (Oral)  Resp 18  Ht 5\' 2"  (1.575 m)  Wt 70.761 kg (156 lb)  BMI 28.53 kg/m2      FHT:  FHR: 120-130 bpm, variability: moderate,  accelerations:  Present,  decelerations:  Absent UC:   regular, every 3-6 minutes SVE:   Dilation: 1 Effacement (%): 60 Station: -3 Exam by:: V.Tiffnay Bossi, CNM  Labs: Lab Results  Component Value Date   WBC 9.4 10/06/2011   HGB 11.3* 10/06/2011   HCT 33.7* 10/06/2011   MCV 86.9 10/06/2011   PLT 324 10/06/2011    Assessment / Plan: Induction of labor due to postterm  Labor: IOL in progress Preeclampsia:  NA Fetal Wellbeing:  Category I Pain Control:  Labor support without medications I/D:  n/a Anticipated MOD:  NSVD  Dorinne Graeff 10/06/2011, 12:56 PM

## 2011-10-06 NOTE — Progress Notes (Signed)
  I spoke with Olivia Abbott and reiterated a realistic expectation for timing of an induction of labor with an unfavorable cervix.  By my exam her EFW is less that 8 pounds and her pelvis is proven for 8 pounds.

## 2011-10-06 NOTE — Plan of Care (Signed)
Problem: Consults Goal: Birthing Suites Patient Information Press F2 to bring up selections list Outcome: Completed/Met Date Met:  10/06/11  Pt > [redacted] weeks EGA and Inpatient induction

## 2011-10-06 NOTE — H&P (Signed)
  Olivia Abbott is a 35 y.o. female G27P2002 with IUP at [redacted]w[redacted]d presenting for induction of labor 2/2 postdates.  [facility] 29 wks  Prenatal History/Complications:  Past Medical History: Past Medical History  Diagnosis Date  . No pertinent past medical history     Past Surgical History: Past Surgical History  Procedure Date  . No past surgeries     Obstetrical History: OB History    Grav Para Term Preterm Abortions TAB SAB Ect Mult Living   3 2 2  0 0 0 0 0 0 2      Gynecological History: OB History    Grav Para Term Preterm Abortions TAB SAB Ect Mult Living   3 2 2  0 0 0 0 0 0 2      Social History: History   Social History  . Marital Status: Single    Spouse Name: N/A    Number of Children: N/A  . Years of Education: N/A   Social History Main Topics  . Smoking status: Never Smoker   . Smokeless tobacco: Never Used  . Alcohol Use: No  . Drug Use: No  . Sexually Active: Yes    Birth Control/ Protection: None   Other Topics Concern  . None   Social History Narrative  . None    Family History: Family History  Problem Relation Age of Onset  . Diabetes Father   . Cancer Maternal Aunt     colon  . Diabetes Maternal Aunt   . Diabetes Maternal Uncle   . Diabetes Paternal Uncle   . Diabetes Maternal Grandmother   . Cancer Maternal Grandfather     prostrate  . Diabetes Paternal Grandfather   . Anesthesia problems Neg Hx   . Hypotension Neg Hx   . Malignant hyperthermia Neg Hx   . Pseudochol deficiency Neg Hx     Allergies: No Known Allergies  Prescriptions prior to admission  Medication Sig Dispense Refill  . acetaminophen (TYLENOL) 500 MG tablet Take 500 mg by mouth every 6 (six) hours as needed. For headache       . famotidine (PEPCID) 20 MG tablet Take 1 tablet (20 mg total) by mouth 2 (two) times daily.  30 tablet  1  . prenatal vitamin w/FE, FA (PRENATAL 1 + 1) 27-1 MG TABS Take 1 tablet by mouth daily.          Review of Systems  - Negative   Blood pressure 105/70, pulse 117, temperature 97.5 F (36.4 C), temperature source Oral, resp. rate 20, height 5\' 2"  (1.575 m), weight 70.761 kg (156 lb). General appearance: alert, cooperative and no distress Lungs: clear to auscultation bilaterally Heart: regular rate and rhythm Abdomen: soft, non-tender; bowel sounds normal  Extremities: Homans sign is negative, no sign of DVT DTR's 2+ Presentation: cephalic Baseline: 140 bpm None Dilation: Closed Effacement (%): 50 Station: -2 Exam by:: F.Dishmon, CNM   Prenatal labs: ABO, Rh: O/POS/-- (10/03 0859) Antibody: NEG (10/03 0859) Rubella:   RPR: NON REAC (10/03 0859)  HBsAg: NEGATIVE (10/03 0859)  HIV: NON REACTIVE (10/03 0859)  GBS: Positive (10/03 0000)  1 hr Glucola 164; No 3 hr;  EFW ~ 7lbs Genetic screening  Too late Anatomy US wnl   Assessment: Olivia Abbott is a 35 y.o. [id number] with an IUP at [redacted]w[redacted]d presenting for IOl for post dates  Plan: Cytotec, GBS prophylaxis   CRESENZO-DISHMAN,Kelli Robeck 10/06/2011, 7:35 AM

## 2011-10-07 MED ORDER — MISOPROSTOL 25 MCG QUARTER TABLET
50.0000 ug | ORAL_TABLET | ORAL | Status: DC
  Administered 2011-10-07 (×2): 50 ug via ORAL
  Filled 2011-10-07 (×2): qty 0.5

## 2011-10-07 MED ORDER — OXYTOCIN 20 UNITS IN LACTATED RINGERS INFUSION - SIMPLE
1.0000 m[IU]/min | INTRAVENOUS | Status: DC
  Administered 2011-10-08: 14 m[IU]/min via INTRAVENOUS
  Administered 2011-10-08: 20 m[IU]/min via INTRAVENOUS
  Filled 2011-10-07: qty 1000

## 2011-10-07 NOTE — Progress Notes (Signed)
Emma-Lee Oddo is a 35 y.o. G3P2002 at [redacted]w[redacted]d admitted for induction of labor due to Post dates.   Subjective: Induction day #2 - will let patient eat, shower, then will restart with cytotec.  Objective: BP 118/74  Pulse 79  Temp(Src) 98.1 F (36.7 C) (Oral)  Resp 20  Ht 5\' 2"  (1.575 m)  Wt 70.761 kg (156 lb)  BMI 28.53 kg/m2      FHT:  FHR: 130s bpm, variability: moderate,  accelerations:  Present,  decelerations:  Absent UC:   none SVE:   Dilation: Fingertip Effacement (%): 20 Station: Ballotable Exam by:: Dr Emelda Fear  Labs: Lab Results  Component Value Date   WBC 9.4 10/06/2011   HGB 11.3* 10/06/2011   HCT 33.7* 10/06/2011   MCV 86.9 10/06/2011   PLT 324 10/06/2011    Assessment / Plan: Will restart cytotec after patient showers.  Nirvaan Frett [name] 10/07/2011, 9:12 AM

## 2011-10-07 NOTE — Progress Notes (Signed)
Olivia Abbott is a 35 y.o. G3P2002 at [redacted]w[redacted]d admitted for induction of labor due to Post dates.  Subjective: Pt resting, requesting more Nubain  Objective: BP 123/70  Pulse 77  Temp(Src) 97.9 F (36.6 C) (Oral)  Resp 18  Ht 5\' 2"  (1.575 m)  Wt 70.761 kg (156 lb)  BMI 28.53 kg/m2      FHT:  FHR: 135 bpm, variability: moderate,  accelerations:  Present,  decelerations:  Absent UC:   q2-5 min SVE:   Deferred until signs of active labor considering pt discomfort with exams Labs: Lab Results  Component Value Date   WBC 9.4 10/06/2011   HGB 11.3* 10/06/2011   HCT 33.7* 10/06/2011   MCV 86.9 10/06/2011   PLT 324 10/06/2011    Assessment / Plan: IOL d/t IUGR  Labor: Progressing on Pitocin, will continue to increase then AROM Fetal Wellbeing:  Category I Pain Control:  IV pain meds PRN Anticipated MOD:  NSVD  FRAZIER,NATALIE 10/07/2011, 3:35 AM

## 2011-10-07 NOTE — Progress Notes (Signed)
Sadeel Fiddler is a 35 y.o. G3P2002 at 100w2d admitted for induction of labor due to Post dates.  Subjective: Pt resting, states contractions are more intense.   Objective: BP 103/66  Pulse 86  Temp(Src) 98.1 F (36.7 C) (Oral)  Resp 18  Ht 5\' 2"  (1.575 m)  Wt 70.761 kg (156 lb)  BMI 28.53 kg/m2      FHT:  FHR: 130 bpm, variability: moderate,  accelerations:  Present,  decelerations:  Absent UC:   2-6 with coupling SVE:   Dilation: 2 Effacement (%): 70 Station: -3 Exam by:: n.Nyoka Alcoser, cnm  Labs: Lab Results  Component Value Date   WBC 9.4 10/06/2011   HGB 11.3* 10/06/2011   HCT 33.7* 10/06/2011   MCV 86.9 10/06/2011   PLT 324 10/06/2011    Assessment / Plan: IOL for postdates, no progress on Pitocin Stop Pitocin now, pt may have light breakfast and shower Consider restarting Cytotec after rest from pitocin  Zong Mcquarrie 10/07/2011, 7:44 AM

## 2011-10-07 NOTE — Progress Notes (Signed)
Subjective: Feels contractions about q5 minutes.  Rates pain at 5/10.  Objective: BP 118/74  Pulse 79  Temp(Src) 98.1 F (36.7 C) (Oral)  Resp 20  Ht 5\' 2"  (1.575 m)  Wt 70.761 kg (156 lb)  BMI 28.53 kg/m2      FHT:  FHR: 140s bpm, variability: moderate,  accelerations:  Present,  decelerations:  Absent UC:   regular, every 5 minutes SVE:   Dilation: Fingertip Effacement (%): 20 Station: Ballotable Exam by:: Dr Emelda Fear  Labs: Lab Results  Component Value Date   WBC 9.4 10/06/2011   HGB 11.3* 10/06/2011   HCT 33.7* 10/06/2011   MCV 86.9 10/06/2011   PLT 324 10/06/2011    Assessment / Plan: IOL day #2.  Will give oral Cytotec q4hrs.   Fetal Wellbeing:  Category I Pain Control:  nubain I/D:  penicillin Anticipated MOD:  NSVD  Olivia Abbott 10/07/2011, 10:45 AM

## 2011-10-07 NOTE — Progress Notes (Signed)
Subjective: Patient feeling contractions.  Objective: BP 105/67  Pulse 91  Temp(Src) 98.2 F (36.8 C) (Oral)  Resp 18  Ht 5\' 2"  (1.575 m)  Wt 70.761 kg (156 lb)  BMI 28.53 kg/m2      FHT:  FHR: 140s bpm, variability: moderate,  accelerations:  Abscent,  decelerations:  Absent UC:   regular, every 3-4 minutes SVE:   Dilation: 1 Effacement (%): 70 Station: -2;-3 Exam by:: dr Adrian Blackwater  Labs: Lab Results  Component Value Date   WBC 9.4 10/06/2011   HGB 11.3* 10/06/2011   HCT 33.7* 10/06/2011   MCV 86.9 10/06/2011   PLT 324 10/06/2011    Assessment / Plan: IOL for postdates.  Foley balloon placed.  Will start low dose pitocin when 4 hours from last misoprostol.  Fetal Wellbeing:  Category I Pain Control:  nubain I/D:  Penicillin Anticipated MOD:  NSVD  STINSON, JACOB [name] 10/07/2011, 6:46 PM

## 2011-10-07 NOTE — Progress Notes (Signed)
Narissa Beaufort is a 35 y.o. G3P2002 at [redacted]w[redacted]d admitted for induction of labor due to Post dates.  Subjective: Patient has pain with contractions, has been receiving Nubain - last dose 2 hours ago; no ROM, no vaginal bleeding, foley bulb still in place; has headache relieved with tylenol, no changes in vision or shortness of breath    Objective: BP 112/59  Pulse 87  Temp(Src) 98.2 F (36.8 C) (Oral)  Resp 18  Ht 5\' 2"  (1.575 m)  Wt 70.761 kg (156 lb)  BMI 28.53 kg/m2    Cardiac: RRR, no murmurs, rubs or gallops Lungs: CTA-B Abdomen: gravid, non-tender Extremities: RLE - 2+ pitting edema of foot and ankle, neurovascularly intact; LLE - 1+ edema, neurovascularly intact  FHT:  FHR: 135 bpm, variability: moderate,  accelerations:  Present,  decelerations:  Absent UC:   irregular, every 3-4 minutes SVE:   Not performed 2/2 foley bulb in place   Labs: Lab Results  Component Value Date   WBC 9.4 10/06/2011   HGB 11.3* 10/06/2011   HCT 33.7* 10/06/2011   MCV 86.9 10/06/2011   PLT 324 10/06/2011    Assessment / Plan: Induction of labor progressing very slowly in latent phase   Labor: foley bulb in place, pitocin in 2 milli-units, continue to titrate pitocin  Fetal Wellbeing:  Category I Pain Control:  Nubain I/D:  GBS prophylaxis PCN G Anticipated MOD:  NSVD  Mat Carne 10/07/2011, 8:47 PM

## 2011-10-07 NOTE — Progress Notes (Signed)
Subjective: IOL for postdates - feeling some contractions with oral cytotec.  Moderate discomfort.  Objective: BP 105/67  Pulse 91  Temp(Src) 98.2 F (36.8 C) (Oral)  Resp 18  Ht 5\' 2"  (1.575 m)  Wt 70.761 kg (156 lb)  BMI 28.53 kg/m2      FHT:  FHR: 130s bpm, variability: moderate,  accelerations:  Present,  decelerations:  Absent UC:   regular, every 4-5 minutes SVE:   Dilation: Fingertip Effacement (%): Thick Station: -3 Exam by:: dr Adrian Blackwater  Labs: Lab Results  Component Value Date   WBC 9.4 10/06/2011   HGB 11.3* 10/06/2011   HCT 33.7* 10/06/2011   MCV 86.9 10/06/2011   PLT 324 10/06/2011    Assessment / Plan: IOL for postdates.  Continue with cytotec. Fetal Wellbeing:  Category I Pain Control:  Nubain I/D:  penicillin for GBS Anticipated MOD:  NSVD  Olivia Abbott Olivia Abbott 10/07/2011, 3:28 PM

## 2011-10-07 NOTE — Progress Notes (Signed)
Zenna Traister is a 35 y.o. G3P2002 at [redacted]w[redacted]d by admitted for induction of labor due to Post dates.  Subjective: Pt states contractions are getting stronger. Coping well.   Objective: BP 118/64  Pulse 74  Temp(Src) 97.9 F (36.6 C) (Oral)  Resp 18  Ht 5\' 2"  (1.575 m)  Wt 70.761 kg (156 lb)  BMI 28.53 kg/m2      FHT:  FHR: 135 bpm, variability: moderate,  accelerations:   ,  decelerations:  Absent UC:   regular, every 2-4 minutes SVE:   Dilation: 2 Effacement (%): 70 Station: -3 Exam by:: n. Bascom Levels, CNM Exam limited by pt discomfort, cervix still very posterior  Labs: Lab Results  Component Value Date   WBC 9.4 10/06/2011   HGB 11.3* 10/06/2011   HCT 33.7* 10/06/2011   MCV 86.9 10/06/2011   PLT 324 10/06/2011    Assessment / Plan: Induction of labor due to postterm,  progressing well on pitocin  Labor: Continue increasing Pitocin until active labor Preeclampsia:  n/a Fetal Wellbeing:  Category I Pain Control:  Nubain and vistaril now I/D:  n/a Anticipated MOD:  NSVD  Charlayne Vultaggio 10/07/2011, 12:20 AM

## 2011-10-08 ENCOUNTER — Inpatient Hospital Stay (HOSPITAL_COMMUNITY): Payer: Commercial Managed Care - PPO | Admitting: Anesthesiology

## 2011-10-08 ENCOUNTER — Encounter (HOSPITAL_COMMUNITY): Payer: Self-pay | Admitting: Anesthesiology

## 2011-10-08 DIAGNOSIS — O480 Post-term pregnancy: Secondary | ICD-10-CM

## 2011-10-08 LAB — CBC
HCT: 34.9 % — ABNORMAL LOW (ref 36.0–46.0)
MCHC: 33.2 g/dL (ref 30.0–36.0)
MCV: 87 fL (ref 78.0–100.0)
RDW: 14.4 % (ref 11.5–15.5)

## 2011-10-08 MED ORDER — FENTANYL 2.5 MCG/ML BUPIVACAINE 1/10 % EPIDURAL INFUSION (WH - ANES)
INTRAMUSCULAR | Status: DC | PRN
  Administered 2011-10-08: 12 mL/h via EPIDURAL

## 2011-10-08 MED ORDER — SODIUM BICARBONATE 8.4 % IV SOLN
INTRAVENOUS | Status: DC | PRN
  Administered 2011-10-08: 4 mL via EPIDURAL

## 2011-10-08 MED ORDER — DIPHENHYDRAMINE HCL 50 MG/ML IJ SOLN: 12.5000 mg | INTRAMUSCULAR | Status: DC | PRN

## 2011-10-08 MED ORDER — PHENYLEPHRINE 40 MCG/ML (10ML) SYRINGE FOR IV PUSH (FOR BLOOD PRESSURE SUPPORT)
80.0000 ug | PREFILLED_SYRINGE | INTRAVENOUS | Status: DC | PRN
  Filled 2011-10-08: qty 5

## 2011-10-08 MED ORDER — EPHEDRINE 5 MG/ML INJ: 10.0000 mg | INTRAVENOUS | Status: DC | PRN

## 2011-10-08 MED ORDER — PHENYLEPHRINE 40 MCG/ML (10ML) SYRINGE FOR IV PUSH (FOR BLOOD PRESSURE SUPPORT): 80.0000 ug | PREFILLED_SYRINGE | INTRAVENOUS | Status: DC | PRN

## 2011-10-08 MED ORDER — LACTATED RINGERS IV SOLN
500.0000 mL | Freq: Once | INTRAVENOUS | Status: AC
  Administered 2011-10-08: 1000 mL via INTRAVENOUS

## 2011-10-08 MED ORDER — MISOPROSTOL 200 MCG PO TABS
ORAL_TABLET | ORAL | Status: AC
  Administered 2011-10-09: 400 ug via ORAL
  Filled 2011-10-08: qty 5

## 2011-10-08 MED ORDER — FENTANYL 2.5 MCG/ML BUPIVACAINE 1/10 % EPIDURAL INFUSION (WH - ANES)
14.0000 mL/h | INTRAMUSCULAR | Status: DC
  Administered 2011-10-08 (×2): 14 mL/h via EPIDURAL
  Filled 2011-10-08 (×4): qty 60

## 2011-10-08 MED ORDER — EPHEDRINE 5 MG/ML INJ
10.0000 mg | INTRAVENOUS | Status: DC | PRN
  Filled 2011-10-08: qty 4

## 2011-10-08 NOTE — Anesthesia Preprocedure Evaluation (Addendum)
Anesthesia Evaluation  Patient identified by MRN, date of birth, ID band Patient awake    Reviewed: Allergy & Precautions, H&P , Patient's Chart, lab work & pertinent test results  Airway Mallampati: II TM Distance: >3 FB Neck ROM: full    Dental  (+) Teeth Intact   Pulmonary  clear to auscultation        Cardiovascular regular Normal    Neuro/Psych    GI/Hepatic   Endo/Other    Renal/GU      Musculoskeletal   Abdominal   Peds  Hematology   Anesthesia Other Findings       Reproductive/Obstetrics (+) Pregnancy                           Anesthesia Physical Anesthesia Plan  ASA: II  Anesthesia Plan: Epidural   Post-op Pain Management:    Induction:   Airway Management Planned:   Additional Equipment:   Intra-op Plan:   Post-operative Plan:   Informed Consent: I have reviewed the patients History and Physical, chart, labs and discussed the procedure including the risks, benefits and alternatives for the proposed anesthesia with the patient or authorized representative who has indicated his/her understanding and acceptance.   Dental Advisory Given  Plan Discussed with:   Anesthesia Plan Comments: (Labs checked- platelets confirmed with RN in room. Fetal heart tracing, per RN, reported to be stable enough for sitting procedure. I have discussed this epidural, especially since she does not want it , but her health care team has explained things to her in such a way as to lead her to feel she has no choice. In addressing her concerns I offered her more than once the opportunity to call this off, and she clearly expressed that this his her choice to elect to have the epidural.  This  patient consents to the procedure:  included risk of possible headache,backache, failed block, allergic reaction, and nerve injury. This patient was asked if she had any questions or concerns before the  procedure started. )       Anesthesia Quick Evaluation

## 2011-10-08 NOTE — Progress Notes (Signed)
Olivia Abbott is a 35 y.o. G3P2002 at [redacted]w[redacted]d by admitted for induction of labor due to Post dates.   Subjective: No complaints   Objective: BP 88/70  Pulse 80  Temp(Src) 98 F (36.7 C) (Oral)  Resp 18  Ht 5\' 2"  (1.575 m)  Wt 70.761 kg (156 lb)  BMI 28.53 kg/m2      FHT:  140, moderate variability, accels present, variable decels x 2  UC:   regular, every 3 minutes SVE:   Dilation: 4.5 Effacement (%): 50 Station: -2 Exam by:: Dr. Clinton Sawyer  Labs: Lab Results  Component Value Date   WBC 9.4 10/06/2011   HGB 11.3* 10/06/2011   HCT 33.7* 10/06/2011   MCV 86.9 10/06/2011   PLT 324 10/06/2011    Assessment / Plan: Induction of labor due to postterm,  progressing well on pitocin  Labor: Progressing on Pitocin, will continue to increase then AROM Fetal Wellbeing:  Category II - improved with lateral positioning and fluid bolus  Pain Control:  Epidural I/D:  PCN for GBS Anticipated MOD:  NSVD  Olivia Abbott 10/08/2011, 12:07 AM

## 2011-10-08 NOTE — Progress Notes (Signed)
Olivia Abbott is a 35 y.o. G3P2002 at [redacted]w[redacted]d admitted for induction of labor due to Post dates.  Subjective: No change, pt would like to get up to take shower.   Objective: BP 100/50  Pulse 83  Temp(Src) 97.5 F (36.4 C) (Oral)  Resp 20  Ht 5\' 2"  (1.575 m)  Wt 70.761 kg (156 lb)  BMI 28.53 kg/m2      FHT:  FHR: 130 bpm, variability: minimal ,  accelerations:  Present,  decelerations:  Absent UC:   regular, every 2-4 minutes SVE:   Dilation: 5.5 Effacement (%): 70 Station: -2 Exam by:: rzhang,rnc-ob  Labs: Lab Results  Component Value Date   WBC 9.4 10/06/2011   HGB 11.3* 10/06/2011   HCT 33.7* 10/06/2011   MCV 86.9 10/06/2011   PLT 324 10/06/2011    Assessment / Plan: IOL for postdates with pitocin  Labor: Slow progress on Pitocin, continue to titrate until active labor  Fetal Wellbeing:  Category II Pain Control:  as patient desires I/D:  n/a Anticipated MOD:  NSVD  FRAZIER,NATALIE 10/08/2011, 10:10 AM

## 2011-10-08 NOTE — Progress Notes (Signed)
Subjective: Pt reports increase in pain.  Objective: BP 105/69  Pulse 68  Temp(Src) 97.6 F (36.4 C) (Oral)  Resp 18  Ht 5\' 2"  (1.575 m)  Wt 70.761 kg (156 lb)  BMI 28.53 kg/m2      FHT:  FHR: 120's bpm, variability: moderate,  accelerations:  Present,  decelerations:  Absent UC:   irregular, every 2-4.5 minutes SVE:   Dilation: 4.5 Effacement (%): 50 Station: -2 Exam by:: Dr. Clinton Sawyer; exam by Roney Marion 3/50/-3.  Labs: Lab Results  Component Value Date   WBC 9.4 10/06/2011   HGB 11.3* 10/06/2011   HCT 33.7* 10/06/2011   MCV 86.9 10/06/2011   PLT 324 10/06/2011    Assessment / Plan: Early Labor  Labor: Early Labor Preeclampsia:  n/a Fetal Wellbeing:  Category I Pain Control:  IV Nubain I/D:  n/a Anticipated MOD:  NSVD  Olivia Abbott,Olivia Abbott 10/08/2011, 4:32 AM

## 2011-10-08 NOTE — Anesthesia Procedure Notes (Signed)
Epidural Patient location during procedure: OB  Preanesthetic Checklist Completed: patient identified, site marked, surgical consent, pre-op evaluation, timeout performed, IV checked, risks and benefits discussed and monitors and equipment checked  Epidural Patient position: sitting Prep: site prepped and draped and DuraPrep Patient monitoring: continuous pulse ox and blood pressure Approach: midline Injection technique: LOR air  Needle:  Needle type: Tuohy  Needle gauge: 17 G Needle length: 9 cm Needle insertion depth: 7 cm Catheter type: closed end flexible Catheter size: 19 Gauge Catheter at skin depth: 14 cm Test dose: negative  Assessment Events: blood not aspirated, injection not painful, no injection resistance, negative IV test and no paresthesia  Additional Notes Dosing of Epidural:  1st dose, through needle ............................................Marland Kitchen epi 1:200K + Xylocaine 40 mg  2nd dose, through catheter, after waiting 3 minutes...Marland KitchenMarland Kitchenepi 1:200K + Xylocaine 40 mg  3rd dose, through catheter after waiting 3 minutes .............................Marcaine   4mg    ( mg Marcaine are expressed as equivilent  cc's medication removed from the 0.1%Bupiv / fentanyl syringe from L&D pump)  ( 2% Xylo charted as a single dose in Epic Meds for ease of charting; actual dosing was fractionated as above, for saftey's sake)  As each dose occurred, patient was free of IV sx; and patient exhibited no evidence of SA injection.  Patient is more comfortable after epidural dosed. Please see RN's note for documentation of vital signs,and FHR which are stable.

## 2011-10-08 NOTE — Progress Notes (Signed)
Calani Gick is a 35 y.o. G3P2002 at [redacted]w[redacted]d by ultrasound admitted for induction of labor due to Post dates.   Subjective: Patient with moderate pelvic pressure, ready to try pushing, has a 10 minutes period of pushing earlier with poor form noted by the nurse   Objective: BP 124/68  Pulse 99  Temp(Src) 97.8 F (36.6 C) (Oral)  Resp 20  Ht 5\' 2"  (1.575 m)  Wt 70.761 kg (156 lb)  BMI 28.53 kg/m2  SpO2 100% I/O last 3 completed shifts: In: -  Out: 500 [Urine:500] Total I/O In: -  Out: 400 [Urine:400]  FHT:  FHR: 150 bpm, variability: moderate,  accelerations:  Present,  decelerations:  Present late x 1, multiple variables UC:   regular, every 2-3 minutes SVE:   Dilation: 10 Effacement (%): 100 Station: +2 Exam by:: dr Clinton Sawyer  Labs: Lab Results  Component Value Date   WBC 10.3 10/08/2011   HGB 11.6* 10/08/2011   HCT 34.9* 10/08/2011   MCV 87.0 10/08/2011   PLT 278 10/08/2011    Assessment / Plan:  Labor: fully dilated, poor pushing effort with minimal urge, pitocin @ 26 milli-units, re-initiate pushing  Fetal Wellbeing:  Category II Pain Control:  Epidural I/D:  PCN Anticipated MOD:  NSVD  Daneille Desilva 10/08/2011, 10:41 PM

## 2011-10-08 NOTE — Progress Notes (Signed)
Shayleen Eppinger is a 35 y.o. G3P2002 at [redacted]w[redacted]d admitted for induction of labor due to Post dates.  Subjective: Comfortable with epidural.  Objective: BP 115/63  Pulse 102  Temp(Src) 98.1 F (36.7 C) (Oral)  Resp 18  Ht 5\' 2"  (1.575 m)  Wt 70.761 kg (156 lb)  BMI 28.53 kg/m2  SpO2 100% I/O last 3 completed shifts: In: -  Out: 500 [Urine:500]    FHT:  FHR: 145 bpm, variability: minimal ,  accelerations:  Abscent,  decelerations:  Present early UC:   regular, every 2-3 minutes SVE:   Dilation: Lip/rim Effacement (%): 100 Station: +1 Exam by:: N Ave Scharnhorst,cnm  Labs: Lab Results  Component Value Date   WBC 10.3 10/08/2011   HGB 11.6* 10/08/2011   HCT 34.9* 10/08/2011   MCV 87.0 10/08/2011   PLT 278 10/08/2011    Assessment / Plan: Induction of labor due to postterm,  progressing well on pitocin  Labor: Progressing well, plan to labor down until urge to push Fetal Wellbeing:  Category II Pain Control:  Epidural I/D:  n/a Anticipated MOD:  NSVD  Ramatoulaye Pack 10/08/2011, 7:43 PM

## 2011-10-08 NOTE — Progress Notes (Signed)
Olivia Abbott is a 35 y.o. G3P2002 at [redacted]w[redacted]d by ultrasound admitted for induction of labor due to Post dates.   Subjective: Patient reports no sensation of contraction, minimal pelvic pressure, no urge to push, pain in left leg that she associates with the epidural, numbness in right leg   Objective: BP 118/73  Pulse 96  Temp(Src) 97.8 F (36.6 C) (Oral)  Resp 20  Ht 5\' 2"  (1.575 m)  Wt 70.761 kg (156 lb)  BMI 28.53 kg/m2  SpO2 100% I/O last 3 completed shifts: In: -  Out: 500 [Urine:500]    FHT:  FHR: 150 bpm, variability: moderate,  accelerations:  Present,  decelerations:  Absent UC:   regular, every 3 minutes SVE:  Dilation: 10 Dilation Complete Date: 10/08/11 Dilation Complete Time: 2104 Effacement (%): 100 Cervical Position: Posterior Station: +2 Presentation: Vertex Exam by:: dr Clinton Sawyer  Labs: Lab Results  Component Value Date   WBC 10.3 10/08/2011   HGB 11.6* 10/08/2011   HCT 34.9* 10/08/2011   MCV 87.0 10/08/2011   PLT 278 10/08/2011    Assessment / Plan:  Labor: 1 stage complete, patient will allow baby to descend with more pressure and then start pushing  Fetal Wellbeing:  Category I Pain Control:  Epidural I/D:  PCN Anticipated MOD:  NSVD  Myron Lona 10/08/2011, 9:05 PM

## 2011-10-08 NOTE — Progress Notes (Signed)
Subjective:  Pt has progressed nicely since epidural placed earlier this morning, and AROM by me at approx 3 pm,   Objective: BP 93/52  Pulse 84  Temp(Src) 98.2 F (36.8 C) (Oral)  Resp 20  Ht 5\' 2"  (1.575 m)  Wt 70.761 kg (156 lb)  BMI 28.53 kg/m2  SpO2 100%      FHT:  FHR: 140 bpm, variability: moderate,  accelerations:  Present,  decelerations:  Absent she had one prolonged decel after AROM. UC:   regular, every 4 minutes SVLabs: Lab Results  Component Value Date   WBC 10.3 10/08/2011   HGB 11.6* 10/08/2011   HCT 34.9* 10/08/2011   MCV 87.0 10/08/2011   PLT 278 10/08/2011   VE: 7/100%/-2, midposition much improved Assessment / Plan: Induction of labor due to postterm,  progressing well on pitocin  Labor: Progressing normally Preeclampsia:   Fetal Wellbeing:  Category I Pain Control:  Epidural I/D:  n/a Anticipated MOD:  NSVD  Koby Pickup V 10/08/2011, 5:29 PM

## 2011-10-09 ENCOUNTER — Encounter (HOSPITAL_COMMUNITY): Payer: Self-pay

## 2011-10-09 DIAGNOSIS — O480 Post-term pregnancy: Secondary | ICD-10-CM

## 2011-10-09 LAB — CBC
HCT: 29.9 % — ABNORMAL LOW (ref 36.0–46.0)
MCV: 86.4 fL (ref 78.0–100.0)
Platelets: 236 10*3/uL (ref 150–400)
RBC: 3.46 MIL/uL — ABNORMAL LOW (ref 3.87–5.11)
RDW: 14.2 % (ref 11.5–15.5)
WBC: 13.6 10*3/uL — ABNORMAL HIGH (ref 4.0–10.5)

## 2011-10-09 MED ORDER — GENTAMICIN SULFATE 40 MG/ML IJ SOLN
1.5000 mg/kg | Freq: Three times a day (TID) | INTRAVENOUS | Status: DC
  Filled 2011-10-09 (×2): qty 2.66

## 2011-10-09 MED ORDER — TETANUS-DIPHTH-ACELL PERTUSSIS 5-2.5-18.5 LF-MCG/0.5 IM SUSP
0.5000 mL | Freq: Once | INTRAMUSCULAR | Status: AC
  Administered 2011-10-10: 0.5 mL via INTRAMUSCULAR
  Filled 2011-10-09: qty 0.5

## 2011-10-09 MED ORDER — ONDANSETRON HCL 4 MG PO TABS: 4.0000 mg | ORAL_TABLET | ORAL | Status: DC | PRN

## 2011-10-09 MED ORDER — WITCH HAZEL-GLYCERIN EX PADS: 1.0000 "application " | MEDICATED_PAD | CUTANEOUS | Status: DC | PRN

## 2011-10-09 MED ORDER — SIMETHICONE 80 MG PO CHEW: 80.0000 mg | CHEWABLE_TABLET | ORAL | Status: DC | PRN

## 2011-10-09 MED ORDER — OXYCODONE-ACETAMINOPHEN 5-325 MG PO TABS
1.0000 | ORAL_TABLET | ORAL | Status: DC | PRN
Start: 2011-10-09 — End: 2011-10-11
  Administered 2011-10-09 – 2011-10-10 (×6): 1 via ORAL
  Filled 2011-10-09 (×6): qty 1

## 2011-10-09 MED ORDER — IBUPROFEN 600 MG PO TABS
600.0000 mg | ORAL_TABLET | Freq: Four times a day (QID) | ORAL | Status: DC
  Administered 2011-10-09 – 2011-10-11 (×9): 600 mg via ORAL
  Filled 2011-10-09 (×8): qty 1

## 2011-10-09 MED ORDER — SENNOSIDES-DOCUSATE SODIUM 8.6-50 MG PO TABS
2.0000 | ORAL_TABLET | Freq: Every day | ORAL | Status: DC
  Administered 2011-10-09 – 2011-10-10 (×2): 2 via ORAL

## 2011-10-09 MED ORDER — DIPHENHYDRAMINE HCL 25 MG PO CAPS
25.0000 mg | ORAL_CAPSULE | Freq: Four times a day (QID) | ORAL | Status: DC | PRN
Start: 2011-10-09 — End: 2011-10-11

## 2011-10-09 MED ORDER — DIBUCAINE 1 % RE OINT: 1.0000 "application " | TOPICAL_OINTMENT | RECTAL | Status: DC | PRN

## 2011-10-09 MED ORDER — LANOLIN HYDROUS EX OINT: TOPICAL_OINTMENT | CUTANEOUS | Status: DC | PRN

## 2011-10-09 MED ORDER — ONDANSETRON HCL 4 MG/2ML IJ SOLN: 4.0000 mg | INTRAMUSCULAR | Status: DC | PRN

## 2011-10-09 MED ORDER — PRENATAL PLUS 27-1 MG PO TABS
1.0000 | ORAL_TABLET | Freq: Every day | ORAL | Status: DC
  Administered 2011-10-09 – 2011-10-11 (×3): 1 via ORAL
  Filled 2011-10-09 (×3): qty 1

## 2011-10-09 MED ORDER — BENZOCAINE-MENTHOL 20-0.5 % EX AERO: 1.0000 "application " | INHALATION_SPRAY | CUTANEOUS | Status: DC | PRN

## 2011-10-09 MED ORDER — ZOLPIDEM TARTRATE 5 MG PO TABS: 5.0000 mg | ORAL_TABLET | Freq: Every evening | ORAL | Status: DC | PRN

## 2011-10-09 MED ORDER — SODIUM CHLORIDE 0.9 % IV SOLN
2.0000 g | Freq: Four times a day (QID) | INTRAVENOUS | Status: DC
  Filled 2011-10-09 (×3): qty 2000

## 2011-10-09 NOTE — Progress Notes (Signed)
Operative Delivery Note At 1:59 AM a viable female was delivered via Vaginal, Vacuum Investment banker, operational).  Presentation: vertex; Position: Occiput,, Anterior; Station: +3.  Verbal consent: obtained from patient.  Risks and benefits discussed in detail.  Risks include, but are not limited to the risks of anesthesia, bleeding, infection, damage to maternal tissues, fetal cephalhematoma.  There is also the risk of inability to effect vaginal delivery of the head, or shoulder dystocia that cannot be resolved by established maneuvers, leading to the need for emergency cesarean section. These were specifically reviewed with family and patient well before Vacuum effort. Vacuum used through 3 contractions by myself and Dr Roslynn Amble, with steady progress to delivery of vertex.  3 popoffs attributed to significantly moulded vertex.  Infant's left shoulder manually rotated from transverse position to occiput anterior for delivery of shoulders.  APGAR: 7, 8; weight 8 lb 2.7 oz (3705 g).   Placenta status: Intact, Spontaneous.   Cord: 3 vessels with the following complications: None.  Cord pH: not requried.  Anesthesia: Epidural  Instruments: Kiwi Episiotomy: None Lacerations: None Suture Repair:  Est. Blood Loss (mL): 400  Mom to postpartum.  Baby to nursery-stable.  Olivia Abbott 10/09/2011, 3:05 AM

## 2011-10-09 NOTE — Progress Notes (Signed)
NST tracing 10/02/11 reviewed in clinic reactive

## 2011-10-09 NOTE — H&P (Signed)
Attestation of Attending Supervision of Advanced Practitioner: Evaluation and management procedures were performed by the PA/NP/CNM/OB Fellow under my supervision/collaboration. Chart reviewed, and agree with management and plan.  Jaynie Collins A M.D. 10/09/2011 11:30 AM

## 2011-10-09 NOTE — Progress Notes (Addendum)
At 0358 Called Dr. Emelda Fear to report temp, 102.4.  Per Dr. Carmon Ginsberg.  Ibuprofen per protocol should handle the elevated temp.

## 2011-10-09 NOTE — Anesthesia Postprocedure Evaluation (Signed)
  Anesthesia Post-op Note  Patient: Olivia Abbott  Procedure(s) Performed: * No procedures listed *  Patient Location: Mother/Baby  Anesthesia Type: Epidural  Level of Consciousness: awake, alert  and oriented  Airway and Oxygen Therapy: Patient Spontanous Breathing  Post-op Pain: none  Post-op Assessment: Post-op Vital signs reviewed and Patient's Cardiovascular Status Stable  Post-op Vital Signs: Reviewed and stable  Complications: No apparent anesthesia complications

## 2011-10-09 NOTE — Progress Notes (Signed)
Olivia Abbott Below is a 35 y.o. G3P2002 at [redacted]w[redacted]d admitted for induction of labor due to Post dates.  Subjective: Patient feels cold, pressure in pelvis and becoming fatigued with pushing; uterin   Objective: BP 120/53  Pulse 90  Temp(Src) 100.6 F (38.1 C) (Axillary)  Resp 20  Ht 5\' 2"  (1.575 m)  Wt 70.761 kg (156 lb)  BMI 28.53 kg/m2  SpO2 100% I/O last 3 completed shifts: In: -  Out: 500 [Urine:500] Total I/O In: -  Out: 400 [Urine:400]  FHT:  FHR: 150 bpm, variability: marked,  accelerations:  Present,  decelerations:  Present early and variable UC:   MVU's < 150, inadequate SVE:   Dilation: 10 Effacement (%): 100 Station: +2 Exam by:: dr Clinton Sawyer  Labs: Lab Results  Component Value Date   WBC 10.3 10/08/2011   HGB 11.6* 10/08/2011   HCT 34.9* 10/08/2011   MCV 87.0 10/08/2011   PLT 278 10/08/2011    Assessment / Plan:  Labor: prolonged second stage, consider using vacuum for assistance; uterine contractions still inadequate at 28 milli-units of pitocin (MVU's  Fetal Wellbeing:  Category II Pain Control:  Epidural I/D:  given fever, start ampicillin at 2g IV q6hrs and gent 1.5 mg/kg IV q 8 hrs  Anticipated MOD:  NSVD  Olivia Abbott 10/09/2011, 12:51 AM

## 2011-10-09 NOTE — Consult Note (Signed)
Called to attend term vaginal delivery with vacuum assistance. No other risk factors reported.  Arrived shortly before full exposure of infant from vertex presentation. Spontaneous cry noted along with low tone. HR > 100, however, and infant responded to tactile stimulation with drying. No dysmorphic features and no indications for supplemental oxygen or other invasive efforts.   Care to assigned pediatrician.   Dagoberto Ligas MD [facility]

## 2011-10-09 NOTE — Progress Notes (Signed)
UR chart review completed.  

## 2011-10-10 NOTE — Progress Notes (Signed)
Post Partum Day 1 for VAVD after IOL for postdates  Subjective: up ad lib, voiding and tolerating PO; painful uterine cramping - given percocet q 3 hours   Objective: Temp:  [97.3 F (36.3 C)-98 F (36.7 C)] 97.3 F (36.3 C) (11/06 0505) Pulse Rate:  [52-66] 57  (11/06 0505) Resp:  [18-20] 18  (11/06 0505) BP: (99-123)/(61-67) 99/61 mmHg (11/06 0505)    Physical Exam:  General: alert, cooperative, appears stated age and no distress Lochia: appropriate Uterine Fundus: firm DVT Evaluation: No evidence of DVT seen on physical exam. 3+ pitting edema of feet, 2+ pre-tibial edema bilaterally  Lungs: CTA -B Cardiac RRR, no murmur   Basename 10/09/11 0601 10/08/11 1139  HGB 10.1* 11.6*  HCT 29.9* 34.9*    Assessment/Plan: Plan for discharge tomorrow Continue with pain medications for uterine cramps.  Contraception - patient will use depo   LOS: 4 days   Mat Carne 10/10/2011, 6:44 AM

## 2011-10-10 NOTE — Progress Notes (Signed)
PSYCHOSOCIAL ASSESSMENT ~ MATERNAL/CHILD  Name: Olivia Abbott Age: 35  Referral Date: 11/06 / 12  Reason/Source: LPNC/ CN  I. FAMILY/HOME ENVIRONMENT  A. Child's Legal Guardian _X__Parent(s) ___Grandparent ___Foster parent ___DSS_________________  Name: Olivia Abbott DOB: // Age: 34  Address: [address]  Name: Olivia Abbott DOB: // Age: 43  Address:  B. Other Household Members/Support Persons Name: Relationship: son 15 yr DOB ___/___/___  Name: Relationship: daughter 16yr DOB ___/___/___  Name: Relationship: DOB ___/___/___  Name: Relationship: DOB ___/___/___  C. Other Support:  II. PSYCHOSOCIAL DATA A. Information Source _X_Patient Interview __Family Interview __Other___________ B. Financial and Community Resources _X_Employment: [employer]  _X_Medicaid County: [place] __Private Insurance: __Self Pay  _X_Food Stamps _X_WIC __Work First _X_Public Housing __Section 8  __Maternity Care Coordination/Child Service Coordination/Early Intervention  ___School: Grade:  __Other:  C. Cultural and Environment Information Cultural Issues Impacting Care:  III. STRENGTHS __X_Supportive family/friends  __X_Adequate Resources  ___Compliance with medical plan  __X_Home prepared for Child (including basic supplies)  ___Understanding of illness  ___Other:  RISK FACTORS AND CURRENT PROBLEMS ____No Problems Noted  LPNC  IV. SOCIAL WORK ASSESSMENT Pt told Sw that she didn't start PNC prior to 8th month of pregnancy due to insurance issues. She explained that she has [employer] which required a $2500 deductible of which she could not afford. Pt did not apply for Medicaid when she learned of pregnancy (@ 20 weeks) because she was unsure about caring the infant to term. She denies illegal substance use during this pregnancy. UDS is negative and meconium is pending. She verbalized understanding of hospital drug testing policy and expressed confidence that results  would be negative. She has supplies for the infant and adequate family support. Pt appears to be appropriate and bonding well with the infant. Sw will follow up with drug screen results and make a referral if needed.  V. 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 STRENGTHS __X_Supportive family/friends __X_Adequate Resources ___Compliance with medical plan __X_Home prepared for Child  (including basic supplies) ___Understanding of illness      ___Other: RISK FACTORS AND CURRENT PROBLEMS         ____No Problems Noted       LPNC                                                                                                                                                                                                                                             IV. SOCIAL WORK ASSESSMENT  Pt told Sw that she didn't start PNC prior to 8th month of pregnancy due to insurance issues.  She explained that she has [facility] which required a $2500 deductible of which she could not afford.  Pt did not apply for Medicaid when she learned of pregnancy (@ 20 weeks) because she was unsure about caring the infant to term.  She denies illegal substance use during this pregnancy.  UDS is negative and meconium is pending.  She verbalized understanding of hospital drug testing policy and expressed confidence that results would be negative.  She has supplies for the infant and adequate family support.  Pt appears to be appropriate and bonding well with the infant.  Sw will follow up with drug screen results and make a referral if needed.   V. SOCIAL WORK PLAN  __X_No Further Intervention Required/No Barriers to Discharge   ___Psychosocial Support and Ongoing Assessment of Needs   ___Patient/Family Education:   ___Child Protective Services Report   County___________ Date___/____/____   ___Information/Referral to [employer]_________________________   ___Other:

## 2011-10-11 MED ORDER — IBUPROFEN 600 MG PO TABS: 600.0000 mg | ORAL_TABLET | Freq: Four times a day (QID) | ORAL | Status: AC

## 2011-10-11 MED ORDER — FERROUS SULFATE 325 (65 FE) MG PO TABS: 325.0000 mg | ORAL_TABLET | Freq: Every day | ORAL | Status: DC

## 2011-10-11 MED ORDER — SENNOSIDES-DOCUSATE SODIUM 8.6-50 MG PO TABS: 2.0000 | ORAL_TABLET | Freq: Every day | ORAL | Status: AC

## 2011-10-11 NOTE — Discharge Summary (Signed)
Obstetric Discharge Summary Reason for Admission: induction of labor  34 yo G3P2002 admitted for IOL for post dates. Had prolonged induction and delivers by vacuum-assisted SVD due to maternal fatigue. Had an isolated maternal fever that resolved postpartum, received one dose IV ampicillin prior to delivery. She was treated adequately with PCN for GBS positive. Postpartum course uncomplicated. In [facility] record, noted an elevated 1-hour GTT at 164, but CBGs were wnl during admission. This should be followed with a 2hr GTT at follow up appointment. Pt plans to bottlefeed and have depo for contraception.   Prenatal Procedures: NST Intrapartum Procedures: vacuum Postpartum Procedures: none Complications-Operative and Postpartum: none Hemoglobin  Date Value Range Status  10/09/2011 10.1* 12.0-15.0 (g/dL) Final     HCT  Date Value Range Status  10/09/2011 29.9* 36.0-46.0 (%) Final    Discharge Diagnoses: Term Pregnancy-delivered and Post-date pregnancy, abnormal fasting glucose   Discharge Information: Date: 10/11/2011 Activity: pelvic rest Diet: routine Medications: PNV, Ibuprofen, Colace and Iron Condition: stable Instructions: refer to practice specific booklet Discharge to: home Follow-up Information    Follow up with [facility]. Make an appointment in 6 weeks. (or as already scheduled)    Contact information:   [address] [place] [address]          Newborn Data: Live born female  Birth Weight: 8 lb 2.7 oz (3705 g) APGAR: 7, 8  Home with mother.  Sheza Strickland 10/11/2011, 9:16 AM

## 2011-10-11 NOTE — ED Provider Notes (Signed)
Post Partum Day 2 Subjective: up ad lib, voiding, tolerating PO and + flatus BM+ Pt states she is bleeding PV more than she expected and going through 6 pads in a day. However, the nurse reports that her bleeding is appropriate for PPD 2. She also c/o cramping that responds to percocet and motrin. No fever, HA, lightheadedness, N&V  Objective: Blood pressure 109/68, pulse 54, temperature 98.2 F (36.8 C), temperature source Oral, resp. rate 18, height 5\' 2"  (1.575 m), weight 156 lb (70.761 kg), SpO2 96.00%, unknown if currently breastfeeding.  Physical Exam:  General: alert, cooperative, appears stated age and no distress Uterine Fundus: firm DVT Evaluation: No evidence of DVT seen on physical exam. Negative Homan's sign. No cords or calf tenderness. Pt has edema in both LL +1   Basename 10/09/11 0601 10/08/11 1139  HGB 10.1* 11.6*  HCT 29.9* 34.9*    Assessment/Plan: Discharge home Continue on Percocet and Motrin for pain from contractions Bottle feed, Depo and will be seen by Dr. Marice Potter on 11/03/11 in the [facility] clinic   LOS: 5 days   Chetan Kapat 10/11/2011, 8:08 AM   I have seen and examined pt, agree with plan.   Tyus Kallam 10/11/11 8:54 AM

## 2011-10-12 NOTE — Discharge Summary (Signed)
Attestation of Attending Supervision of Resident: Evaluation and management procedures were performed by the [facility] Medicine Resident under my supervision.  I have reviewed the resident's note, chart reviewed and agree with management and plan.  Olivia Abbott A 10/12/2011 10:24 AM

## 2011-11-03 ENCOUNTER — Ambulatory Visit (INDEPENDENT_AMBULATORY_CARE_PROVIDER_SITE_OTHER): Payer: Commercial Managed Care - PPO | Admitting: Obstetrics & Gynecology

## 2011-11-03 DIAGNOSIS — Z309 Encounter for contraceptive management, unspecified: Secondary | ICD-10-CM

## 2011-11-03 DIAGNOSIS — Z3049 Encounter for surveillance of other contraceptives: Secondary | ICD-10-CM

## 2011-11-03 MED ORDER — MEDROXYPROGESTERONE ACETATE 150 MG/ML IM SUSP
150.0000 mg | Freq: Once | INTRAMUSCULAR | Status: AC
  Administered 2011-11-03: 150 mg via INTRAMUSCULAR

## 2011-11-03 NOTE — Patient Instructions (Signed)
It was great to see you today!  To improve your back, and get rid of pain you need to do back exercises and stretches.  Go to this web site:  [email or web address]

## 2011-11-03 NOTE — Progress Notes (Signed)
Addended by: Toula Moos on: 11/03/2011 11:28 AM   Modules accepted: Orders

## 2011-11-03 NOTE — Assessment & Plan Note (Signed)
Back pain - gave patient exercises Blues: low score, patient says she is feeling better. No homicidal/suicidal thoughts. BC: depo  F/u in three months for repeat depo. Vaginal bleeding improving. No urinary or stool concerns. Baby doing well.

## 2011-11-03 NOTE — Progress Notes (Signed)
  Subjective:    Patient ID: Olivia Abbott, female    DOB: 04-Feb-1976, 35 y.o.   MRN: [id number]  HPI 1. Postpartum visit C/O feeling down and Back pain. She scored 7 on her post partum depression questionnaire. <10 She has no breast pain or redness. She is using formula. Breasts are going down. She has no issues with bowels or urine. She has small amount of vaginal bleeding, which is decreasing. She is getting depo today for [facility]. She is not currently sexually active. She received the flu shot during pregnancy.  Review of Systems No fever, no chills, see HPI    Objective:   Physical Exam Filed Vitals:   11/03/11 1006  BP: 105/73  Pulse: 104  Temp: 97.8 F (36.6 C)  TempSrc: Oral  Height: 5' 3.25" (1.607 m)  Weight: 135 lb 4.8 oz (61.372 kg)  Lungs:  Normal respiratory effort, chest expands symmetrically. Lungs are clear to auscultation, no crackles or wheezes.  Back - Normal skin, Spine with normal alignment and no deformity.  No tenderness to vertebral process palpation.  Paraspinous muscles are tender,  with spasm on the right side.   Range of motion is full at neck and lumbar sacral regions Extremities:  No cyanosis, edema, or deformity noted General appearance: alert and cooperative Pelvic: cervix normal in appearance, external genitalia normal, no adnexal masses or tenderness, no cervical motion tenderness, rectovaginal septum normal, uterus normal size, shape, and consistency and vagina normal without discharge     Assessment & Plan:

## 2011-11-17 ENCOUNTER — Ambulatory Visit (INDEPENDENT_AMBULATORY_CARE_PROVIDER_SITE_OTHER): Payer: Commercial Managed Care - PPO | Admitting: Obstetrics & Gynecology

## 2011-11-17 VITALS — BP 102/70 | HR 72 | Temp 96.6°F | Ht 63.5 in | Wt 134.7 lb

## 2011-11-17 DIAGNOSIS — M545 Low back pain, unspecified: Secondary | ICD-10-CM

## 2011-11-17 DIAGNOSIS — M549 Dorsalgia, unspecified: Secondary | ICD-10-CM

## 2011-11-17 MED ORDER — CYCLOBENZAPRINE HCL 10 MG PO TABS: 10.0000 mg | ORAL_TABLET | Freq: Three times a day (TID) | ORAL | Status: AC | PRN

## 2011-11-17 MED ORDER — DICLOFENAC SODIUM 75 MG PO TBEC: 75.0000 mg | DELAYED_RELEASE_TABLET | Freq: Two times a day (BID) | ORAL | Status: AC

## 2011-11-17 MED ORDER — HYDROCODONE-ACETAMINOPHEN 5-500 MG PO TABS: 1.0000 | ORAL_TABLET | ORAL | Status: AC | PRN

## 2011-11-17 NOTE — Progress Notes (Signed)
Pt scheduled with [facility] Thursday Dec 27 10:30.

## 2011-11-17 NOTE — Progress Notes (Signed)
Patient still having debilitating persistent back pain in the thoracolumbar region after vaginal delivery.  She attributes this to the epidural.  No fevers or any sign/symptom of infection.  Normal exam by Dr. Rivka Safer during her last visit.  Given her persistent back pain, will refer to orthopedics.  Also prescribed some Vicodin, Diclofenac and Flexeril prn pain.  Patient to return to clinic for any gynecologic concerns or for annual exam.

## 2011-12-08 ENCOUNTER — Encounter: Payer: Self-pay | Admitting: Obstetrics & Gynecology

## 2012-01-23 ENCOUNTER — Ambulatory Visit (INDEPENDENT_AMBULATORY_CARE_PROVIDER_SITE_OTHER): Payer: Commercial Managed Care - PPO

## 2012-01-23 VITALS — BP 117/72 | HR 80

## 2012-01-23 DIAGNOSIS — Z3049 Encounter for surveillance of other contraceptives: Secondary | ICD-10-CM

## 2012-01-23 MED ORDER — MEDROXYPROGESTERONE ACETATE 150 MG/ML IM SUSP
150.0000 mg | Freq: Once | INTRAMUSCULAR | Status: AC
  Administered 2012-01-23: 150 mg via INTRAMUSCULAR

## 2012-03-06 ENCOUNTER — Encounter (HOSPITAL_COMMUNITY): Payer: Self-pay | Admitting: Emergency Medicine

## 2012-03-06 ENCOUNTER — Emergency Department (HOSPITAL_COMMUNITY)
Admission: EM | Admit: 2012-03-06 | Discharge: 2012-03-06 | Disposition: A | Discharge status: Home / Self Care | Payer: Commercial Managed Care - PPO | Source: Home / Self Care | Attending: Family Medicine | Admitting: Family Medicine

## 2012-03-06 DIAGNOSIS — J310 Chronic rhinitis: Secondary | ICD-10-CM

## 2012-03-06 DIAGNOSIS — R059 Cough, unspecified: Secondary | ICD-10-CM

## 2012-03-06 DIAGNOSIS — R05 Cough: Secondary | ICD-10-CM

## 2012-03-06 MED ORDER — GUAIFENESIN-CODEINE 100-10 MG/5ML PO SYRP: 5.0000 mL | ORAL_SOLUTION | Freq: Four times a day (QID) | ORAL | Status: AC | PRN

## 2012-03-06 MED ORDER — GUAIFENESIN-CODEINE 100-10 MG/5ML PO SYRP: 5.0000 mL | ORAL_SOLUTION | Freq: Four times a day (QID) | ORAL | Status: DC | PRN

## 2012-03-06 MED ORDER — FLUTICASONE PROPIONATE 50 MCG/ACT NA SUSP: 2.0000 | Freq: Every day | NASAL | Status: DC

## 2012-03-06 NOTE — ED Notes (Signed)
Pt. Stated. I've had a cough with achiness all over since last thursday

## 2012-03-06 NOTE — ED Provider Notes (Signed)
History     CSN: [id number]  Arrival date & time 03/06/12  4098   First MD Initiated Contact with Patient 03/06/12 [phone]      Chief Complaint  Patient presents with  . Cough    (Consider location/radiation/quality/duration/timing/severity/associated sxs/prior treatment) HPI Comments: Stormi presents for evaluation of persistent cough, aches, since Thursday. She denies any true fever. She has been taking OTC medications without much relief.  Patient is a 36 y.o. female presenting with URI. The history is provided by the patient.  URI The primary symptoms include fatigue, cough and myalgias. Primary symptoms do not include fever. The current episode started 3 to 5 days ago. This is a new problem. The problem has not changed since onset. Symptoms associated with the illness include chills, congestion and rhinorrhea.    Past Medical History  Diagnosis Date  . No pertinent past medical history     Past Surgical History  Procedure Date  . No past surgeries     Family History  Problem Relation Age of Onset  . Diabetes Father   . Cancer Maternal Aunt     colon  . Diabetes Maternal Aunt   . Diabetes Maternal Uncle   . Diabetes Paternal Uncle   . Diabetes Maternal Grandmother   . Cancer Maternal Grandfather     prostrate  . Diabetes Paternal Grandfather   . Anesthesia problems Neg Hx   . Hypotension Neg Hx   . Malignant hyperthermia Neg Hx   . Pseudochol deficiency Neg Hx     History  Substance Use Topics  . Smoking status: Never Smoker   . Smokeless tobacco: Never Used  . Alcohol Use: No    OB History    Grav Para Term Preterm Abortions TAB SAB Ect Mult Living   3 3 3  0 0 0 0 0 0 3      Review of Systems  Constitutional: Positive for chills and fatigue. Negative for fever.  HENT: Positive for congestion and rhinorrhea. Negative for trouble swallowing.   Eyes: Negative.   Respiratory: Positive for cough.   Cardiovascular: Negative.   Gastrointestinal:  Negative.   Genitourinary: Negative.   Musculoskeletal: Positive for myalgias.  Skin: Negative.   Neurological: Negative.     Allergies  Review of patient's allergies indicates no known allergies.  Home Medications   Current Outpatient Rx  Name Route Sig Dispense Refill  . ACETAMINOPHEN 500 MG PO TABS Oral Take 500 mg by mouth every 6 (six) hours as needed. For headache     . DICLOFENAC SODIUM 75 MG PO TBEC Oral Take 1 tablet (75 mg total) by mouth 2 (two) times daily with a meal. 60 tablet 5  . FAMOTIDINE 20 MG PO TABS Oral Take 1 tablet (20 mg total) by mouth 2 (two) times daily. 30 tablet 1  . FERROUS SULFATE 325 (65 FE) MG PO TABS Oral Take 1 tablet (325 mg total) by mouth daily with breakfast. 30 tablet 11  . FLUTICASONE PROPIONATE 50 MCG/ACT NA SUSP Nasal Place 2 sprays into the nose daily. 16 g 2  . GUAIFENESIN-CODEINE 100-10 MG/5ML PO SYRP Oral Take 5 mLs by mouth every 6 (six) hours as needed for cough or congestion. 120 mL 0  . PRENATAL PLUS 27-1 MG PO TABS Oral Take 1 tablet by mouth daily.      Bernadette Hoit SODIUM 8.6-50 MG PO TABS Oral Take 2 tablets by mouth at bedtime. 60 tablet 0    BP 108/73  Pulse 88  Temp(Src) 98.5 F (36.9 C) (Oral)  Resp 17  SpO2 98%  Physical Exam  Nursing note and vitals reviewed. Constitutional: She is oriented to person, place, and time. She appears well-developed and well-nourished.  HENT:  Head: Normocephalic and atraumatic.  Right Ear: Tympanic membrane normal.  Left Ear: Tympanic membrane normal.  Mouth/Throat: Uvula is midline, oropharynx is clear and moist and mucous membranes are normal.  Eyes: EOM are normal.  Neck: Normal range of motion.  Cardiovascular: Normal rate, regular rhythm, S1 normal, S2 normal and normal heart sounds.   No murmur heard. Pulmonary/Chest: Effort normal and breath sounds normal. She has no decreased breath sounds. She has no wheezes. She has no rhonchi.  Musculoskeletal: Normal range of  motion.  Neurological: She is alert and oriented to person, place, and time.  Skin: Skin is warm and dry.  Psychiatric: Her behavior is normal.    ED Course  Procedures (including critical care time)  Labs Reviewed - No data to display No results found.   1. Rhinitis   2. Cough       MDM  rx given for fluticasone and guaifenesin AC        Renaee Munda, MD 03/06/12 1252

## 2012-03-06 NOTE — Discharge Instructions (Signed)
Use nasal spray as directed. Use the prescription cough syrup, as needed and as directed. Should you have fever, I recommend aggressive fever control with acetaminophen (Tylenol) and/or ibuprofen. You may use these together, alternating them every 4 hours, or individually, every 8 hours. For example, take acetaminophen 500 to 1000 mg at 12 noon, then 600 to 800 mg of ibuprofen at 4 pm, then acetaminophen at 8 pm, etc. Also, stay hydrated with clear liquids. Return to care should your symptoms not improve, or worsen in any way.

## 2012-04-10 ENCOUNTER — Ambulatory Visit: Payer: Commercial Managed Care - PPO

## 2012-04-16 ENCOUNTER — Ambulatory Visit (INDEPENDENT_AMBULATORY_CARE_PROVIDER_SITE_OTHER): Payer: Commercial Managed Care - PPO

## 2012-04-16 VITALS — BP 118/81 | HR 69

## 2012-04-16 DIAGNOSIS — Z3049 Encounter for surveillance of other contraceptives: Secondary | ICD-10-CM

## 2012-04-16 MED ORDER — MEDROXYPROGESTERONE ACETATE 150 MG/ML IM SUSP
150.0000 mg | Freq: Once | INTRAMUSCULAR | Status: AC
  Administered 2012-04-16: 150 mg via INTRAMUSCULAR

## 2012-07-03 ENCOUNTER — Ambulatory Visit (INDEPENDENT_AMBULATORY_CARE_PROVIDER_SITE_OTHER): Payer: Commercial Managed Care - PPO | Admitting: *Deleted

## 2012-07-03 VITALS — BP 109/74 | HR 85 | Temp 96.6°F | Wt 143.8 lb

## 2012-07-03 DIAGNOSIS — Z3049 Encounter for surveillance of other contraceptives: Secondary | ICD-10-CM

## 2012-07-03 DIAGNOSIS — Z30019 Encounter for initial prescription of contraceptives, unspecified: Secondary | ICD-10-CM

## 2012-07-03 MED ORDER — MEDROXYPROGESTERONE ACETATE 150 MG/ML IM SUSP
150.0000 mg | Freq: Once | INTRAMUSCULAR | Status: AC
  Administered 2012-07-03: 150 mg via INTRAMUSCULAR

## 2012-09-18 ENCOUNTER — Ambulatory Visit: Payer: Commercial Managed Care - PPO

## 2012-09-25 ENCOUNTER — Ambulatory Visit (INDEPENDENT_AMBULATORY_CARE_PROVIDER_SITE_OTHER): Payer: Commercial Managed Care - PPO | Admitting: *Deleted

## 2012-09-25 VITALS — BP 114/71 | HR 82 | Temp 97.0°F | Ht 63.5 in | Wt 142.9 lb

## 2012-09-25 DIAGNOSIS — Z3049 Encounter for surveillance of other contraceptives: Secondary | ICD-10-CM

## 2012-09-25 MED ORDER — MEDROXYPROGESTERONE ACETATE 150 MG/ML IM SUSP
150.0000 mg | Freq: Once | INTRAMUSCULAR | Status: AC
  Administered 2012-09-25: 150 mg via INTRAMUSCULAR

## 2012-12-13 ENCOUNTER — Ambulatory Visit (INDEPENDENT_AMBULATORY_CARE_PROVIDER_SITE_OTHER): Payer: Self-pay | Admitting: Medical

## 2012-12-13 ENCOUNTER — Encounter: Payer: Self-pay | Admitting: Medical

## 2012-12-13 VITALS — BP 100/75 | HR 75 | Temp 97.9°F | Ht 63.5 in | Wt 140.4 lb

## 2012-12-13 DIAGNOSIS — Z01419 Encounter for gynecological examination (general) (routine) without abnormal findings: Secondary | ICD-10-CM

## 2012-12-13 DIAGNOSIS — N898 Other specified noninflammatory disorders of vagina: Secondary | ICD-10-CM

## 2012-12-13 DIAGNOSIS — Z3049 Encounter for surveillance of other contraceptives: Secondary | ICD-10-CM

## 2012-12-13 MED ORDER — MEDROXYPROGESTERONE ACETATE 150 MG/ML IM SUSP
150.0000 mg | INTRAMUSCULAR | Status: DC
  Administered 2012-12-13: 150 mg via INTRAMUSCULAR

## 2012-12-13 NOTE — Progress Notes (Signed)
Subjective:     Patient ID: Olivia Abbott, female   DOB: Jun 27, 1976, 37 y.o.   MRN: [id number]  HPI Ms. Olivia Abbott is a 37 y.o. G3 P3 female who presents to clinic today for an annual exam. The patient believes she had her last pap smear in December 2012, however we do not have these results. The patient states that she has never had an abnormal pap smear. She has been on Depo Provera for birth control since her last child was born in November 2012. She had been on it previously between pregnancies as well. She is happy with the Depo Provera and will get her next injection today. The patient states that she has bleeding, mostly light and sometimes irregular, but she is ok with this. The patient does complain of a thick, white, milky discharge for the last month. She denies itching, irritation or dysuria. The patient denies any other chronic medical problems.   Review of Systems All negative unless otherwise noted in HPI    Objective:   Physical Exam  Constitutional: She is oriented to person, place, and time. She appears well-developed and well-nourished. No distress.  HENT:  Head: Normocephalic and atraumatic.  Cardiovascular: Normal rate, regular rhythm and normal heart sounds.  Exam reveals no gallop and no friction rub.   No murmur heard. Pulmonary/Chest: Effort normal and breath sounds normal. No respiratory distress.  Abdominal: Soft. Bowel sounds are normal. She exhibits no distension and no mass. There is no tenderness. There is no rebound and no guarding.  Genitourinary: Vagina normal. There is no rash or lesion on the right labia. There is no rash or lesion on the left labia. Uterus is not enlarged and not tender. Cervix exhibits discharge (small amount of thick, white discharge noted on the cervix and in the vagina). Cervix exhibits no motion tenderness and no friability. Right adnexum displays no mass and no tenderness. Left adnexum displays no mass and no tenderness.  Neurological:  She is alert and oriented to person, place, and time.  Skin: Skin is warm and dry. No erythema.  Psychiatric: She has a normal mood and affect.   BP 100/75  Pulse 75  Temp 97.9 F (36.6 C) (Oral)  Ht 5' 3.5" (1.613 m)  Wt 140 lb 6.4 oz (63.685 kg)  BMI 24.48 kg/m2  LMP 12/04/2012  Breastfeeding? No     Assessment:     Normal well woman exam Pap smear obtained Wet prep obtained    Plan:     Patient to receive depo injection today. Return in 12 weeks for next injection.  Will call patient with wet prep results if treatment is required Patient will receive pap letter if results are normal. Next pap in 3 years if normal and follow-up for annual exam in one year.     Freddi Starr, PA-C 12/13/2012 10:17 AM

## 2012-12-13 NOTE — Addendum Note (Signed)
Addended by: Freddi Starr on: 12/13/2012 10:44 AM   Modules accepted: Orders

## 2012-12-13 NOTE — Patient Instructions (Addendum)
Pap Test A Pap test checks the cells on the surface of your cervix. Your doctor will look for cell changes that are not normal, an infection, or cancer. If the cells no longer look normal, it is called dysplasia. Dysplasia can turn into cancer. Regular Pap tests are important to stop cancer from developing. BEFORE THE PROCEDURE  Ask your doctor when to schedule your Pap test. Timing the test around your period may be important.  Do not douche or have sex (intercourse) for 24 hours before the test.  Do not put creams on your vagina or use tampons for 24 hours before the test.  Go pee (urinate) just before the test. PROCEDURE  You will lie on an exam table with your feet in stirrups.  A warm metal or plastic tool (speculum) will be put in your vagina to open it up.  Your doctor will use a small, plastic brush or wooden spatula to take cells from your cervix.  The cells will be put in a lab container.  The cells will be checked under a microscope to see if they are normal or not. AFTER THE PROCEDURE Get your test results. If they are abnormal, you may need more tests. Document Released: 12/23/2010 Document Revised: 02/12/2012 Document Reviewed: 11/16/2011 [employer] Patient Information 2013 [employer].  

## 2013-03-03 ENCOUNTER — Ambulatory Visit (INDEPENDENT_AMBULATORY_CARE_PROVIDER_SITE_OTHER): Payer: Commercial Managed Care - PPO | Admitting: *Deleted

## 2013-03-03 VITALS — BP 113/72 | HR 83 | Temp 97.6°F | Wt 142.7 lb

## 2013-03-03 DIAGNOSIS — Z3049 Encounter for surveillance of other contraceptives: Secondary | ICD-10-CM

## 2013-03-03 MED ORDER — MEDROXYPROGESTERONE ACETATE 150 MG/ML IM SUSP
150.0000 mg | Freq: Once | INTRAMUSCULAR | Status: AC
  Administered 2013-03-03: 150 mg via INTRAMUSCULAR

## 2013-05-21 ENCOUNTER — Ambulatory Visit (INDEPENDENT_AMBULATORY_CARE_PROVIDER_SITE_OTHER): Payer: Commercial Managed Care - PPO

## 2013-05-21 VITALS — BP 110/74 | HR 75 | Wt 142.3 lb

## 2013-05-21 DIAGNOSIS — Z3049 Encounter for surveillance of other contraceptives: Secondary | ICD-10-CM

## 2013-05-21 MED ORDER — MEDROXYPROGESTERONE ACETATE 150 MG/ML IM SUSP
150.0000 mg | Freq: Once | INTRAMUSCULAR | Status: AC
  Administered 2013-05-21: 150 mg via INTRAMUSCULAR

## 2013-08-08 ENCOUNTER — Ambulatory Visit (INDEPENDENT_AMBULATORY_CARE_PROVIDER_SITE_OTHER): Payer: Commercial Managed Care - PPO | Admitting: Obstetrics and Gynecology

## 2013-08-08 VITALS — BP 99/69 | HR 68 | Wt 141.0 lb

## 2013-08-08 DIAGNOSIS — Z3049 Encounter for surveillance of other contraceptives: Secondary | ICD-10-CM

## 2013-08-08 MED ORDER — MEDROXYPROGESTERONE ACETATE 150 MG/ML IM SUSP
150.0000 mg | Freq: Once | INTRAMUSCULAR | Status: AC
  Administered 2013-08-08: 150 mg via INTRAMUSCULAR

## 2013-11-07 ENCOUNTER — Ambulatory Visit: Payer: Commercial Managed Care - PPO

## 2014-08-27 ENCOUNTER — Ambulatory Visit: Payer: Commercial Managed Care - PPO | Admitting: Family Medicine

## 2014-10-05 ENCOUNTER — Encounter: Payer: Self-pay | Admitting: Medical

## 2015-08-21 ENCOUNTER — Encounter (HOSPITAL_COMMUNITY): Payer: Self-pay | Admitting: Emergency Medicine

## 2015-08-21 DIAGNOSIS — R05 Cough: Secondary | ICD-10-CM | POA: Diagnosis present

## 2015-08-21 DIAGNOSIS — Y9389 Activity, other specified: Secondary | ICD-10-CM | POA: Diagnosis not present

## 2015-08-21 DIAGNOSIS — T7840XA Allergy, unspecified, initial encounter: Secondary | ICD-10-CM | POA: Diagnosis not present

## 2015-08-21 DIAGNOSIS — L500 Allergic urticaria: Secondary | ICD-10-CM | POA: Insufficient documentation

## 2015-08-21 DIAGNOSIS — X58XXXA Exposure to other specified factors, initial encounter: Secondary | ICD-10-CM | POA: Diagnosis not present

## 2015-08-21 DIAGNOSIS — Y998 Other external cause status: Secondary | ICD-10-CM | POA: Diagnosis not present

## 2015-08-21 DIAGNOSIS — Y9289 Other specified places as the place of occurrence of the external cause: Secondary | ICD-10-CM | POA: Diagnosis not present

## 2015-08-21 DIAGNOSIS — J309 Allergic rhinitis, unspecified: Secondary | ICD-10-CM | POA: Diagnosis not present

## 2015-08-21 DIAGNOSIS — H1013 Acute atopic conjunctivitis, bilateral: Secondary | ICD-10-CM | POA: Diagnosis not present

## 2015-08-21 NOTE — ED Notes (Signed)
Pt reports she has been having bad allergies for 2 weeks- cough, runny nose, itchy eyes. Pt was taking benadryl sometimes without relief. Friday night she had several different kinds of foods then her hands began to itch. Today she woke up with hives all over her. She has not taken benadryl today.

## 2015-08-22 ENCOUNTER — Emergency Department (HOSPITAL_COMMUNITY): Payer: BLUE CROSS/BLUE SHIELD

## 2015-08-22 ENCOUNTER — Emergency Department (HOSPITAL_COMMUNITY)
Admission: EM | Admit: 2015-08-22 | Discharge: 2015-08-22 | Disposition: A | Discharge status: Home / Self Care | Payer: BLUE CROSS/BLUE SHIELD | Attending: Emergency Medicine | Admitting: Emergency Medicine

## 2015-08-22 DIAGNOSIS — H1013 Acute atopic conjunctivitis, bilateral: Secondary | ICD-10-CM

## 2015-08-22 DIAGNOSIS — T7840XA Allergy, unspecified, initial encounter: Secondary | ICD-10-CM

## 2015-08-22 DIAGNOSIS — J309 Allergic rhinitis, unspecified: Secondary | ICD-10-CM

## 2015-08-22 IMAGING — CR DG CHEST 2V
2 series · 2 of 2 positions shown · non-contrast
Comparison: [DATE]

CLINICAL DATA: Cough for 2 weeks.

EXAM:
CHEST  2 VIEW

[chest pa]
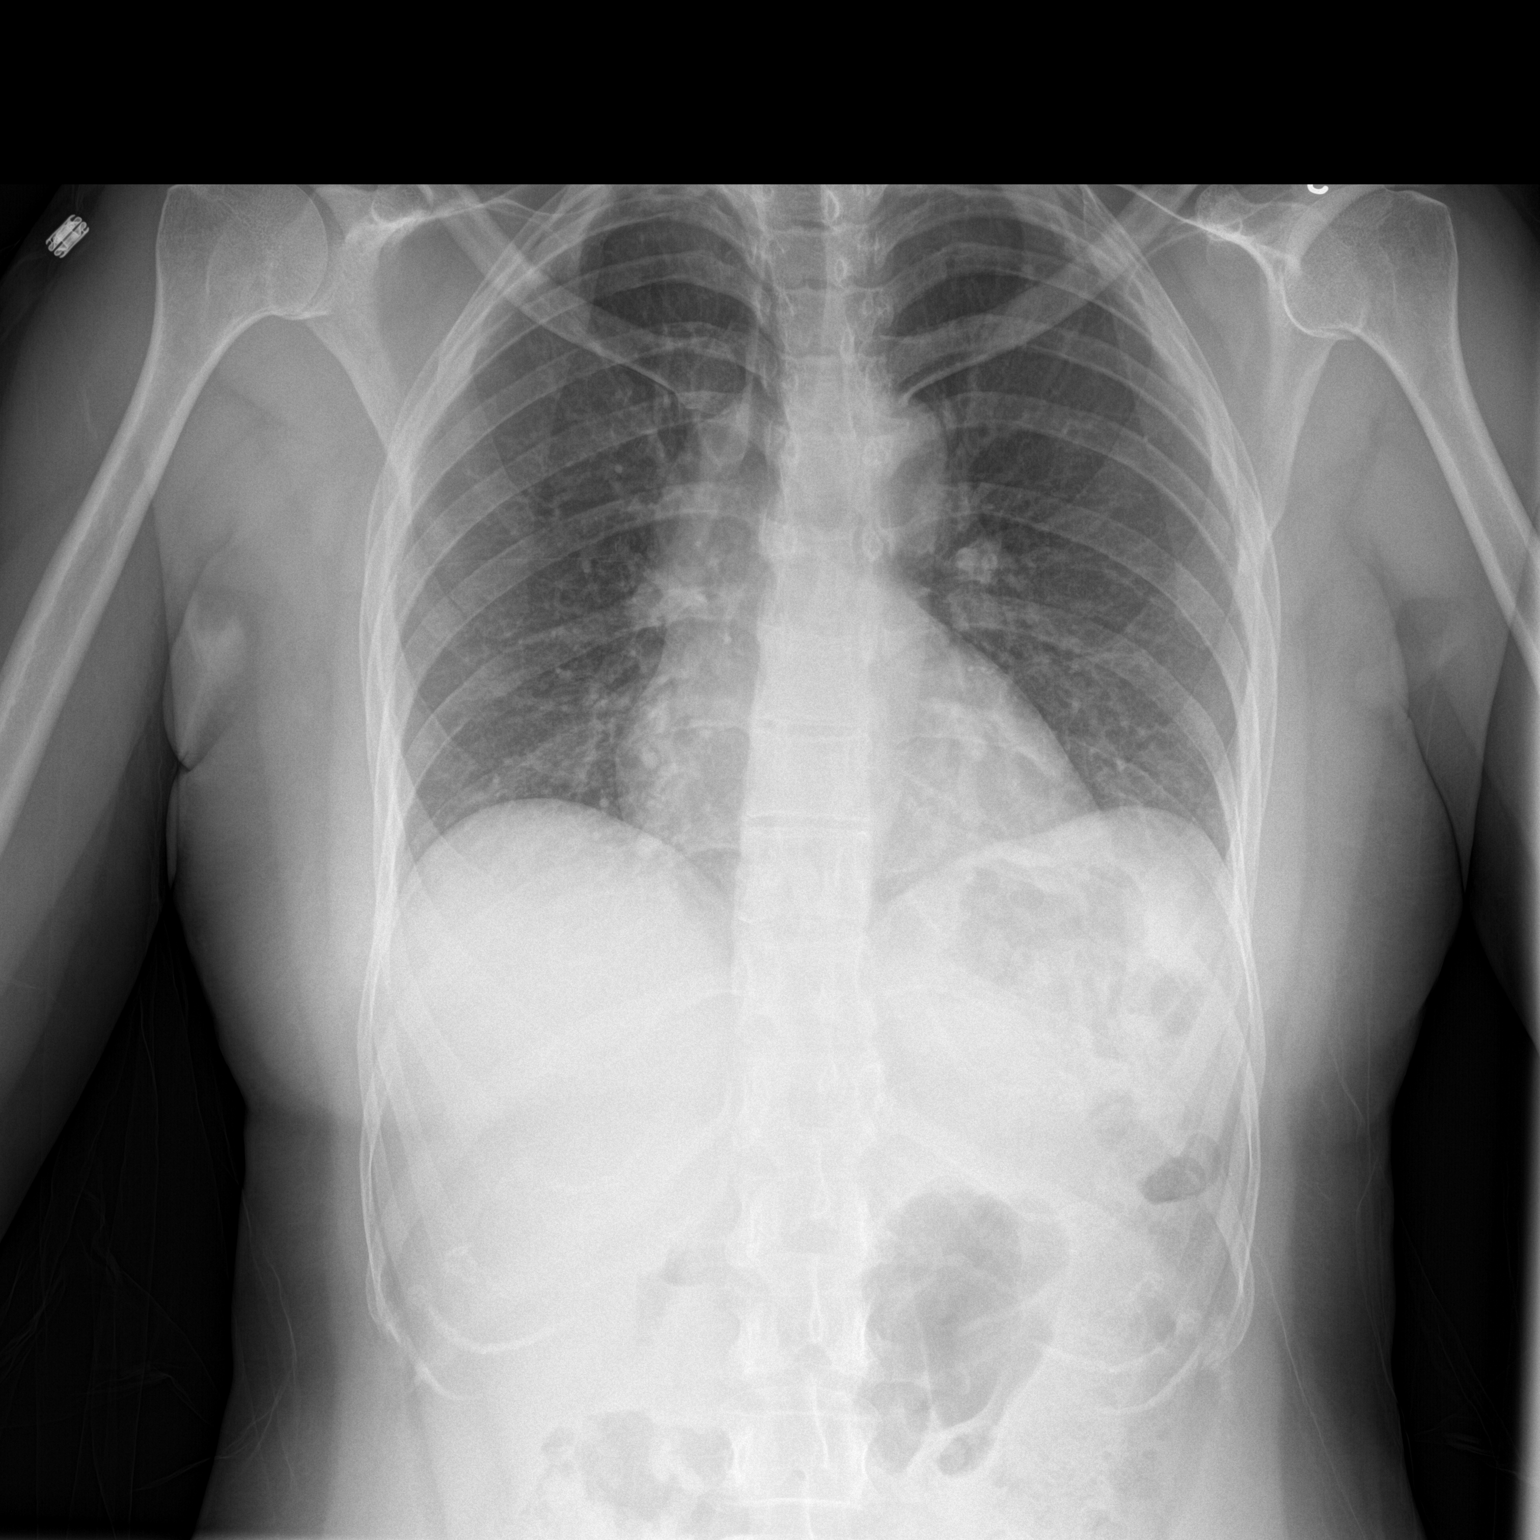

[chest lat]
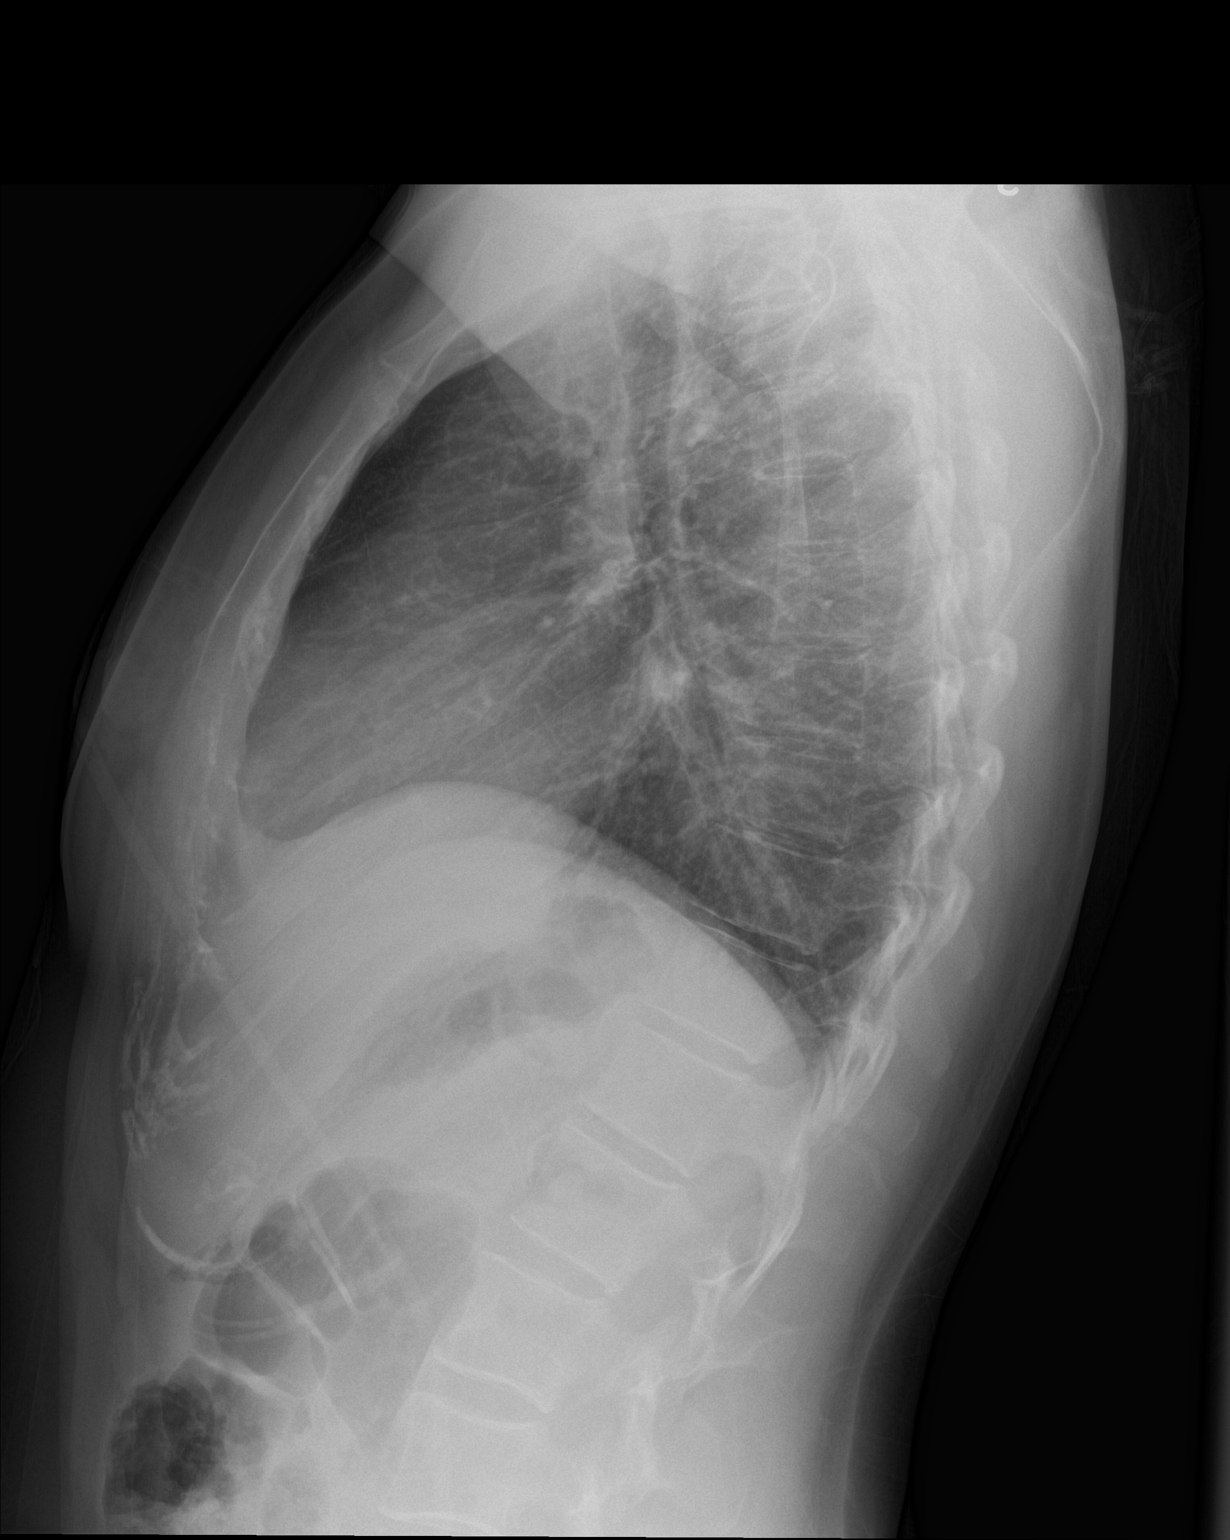

[2 of 2 positions shown; findings below may reference images not displayed]

FINDINGS: The cardiomediastinal contours are normal. The lungs are clear.
Pulmonary vasculature is normal. No consolidation, pleural effusion,
or pneumothorax. No acute osseous abnormalities are seen.
IMPRESSION: No acute pulmonary process.

## 2015-08-22 MED ORDER — DEXAMETHASONE SODIUM PHOSPHATE 10 MG/ML IJ SOLN
10.0000 mg | Freq: Once | INTRAMUSCULAR | Status: AC
  Administered 2015-08-22: 10 mg via INTRAVENOUS
  Filled 2015-08-22: qty 1

## 2015-08-22 MED ORDER — FAMOTIDINE 20 MG PO TABS: 20.0000 mg | ORAL_TABLET | Freq: Two times a day (BID) | ORAL | Status: DC | PRN

## 2015-08-22 MED ORDER — FAMOTIDINE IN NACL 20-0.9 MG/50ML-% IV SOLN
20.0000 mg | Freq: Once | INTRAVENOUS | Status: AC
  Administered 2015-08-22: 20 mg via INTRAVENOUS
  Filled 2015-08-22: qty 50

## 2015-08-22 MED ORDER — DIPHENHYDRAMINE HCL 25 MG PO TABS: 25.0000 mg | ORAL_TABLET | Freq: Four times a day (QID) | ORAL | Status: DC | PRN

## 2015-08-22 MED ORDER — DIPHENHYDRAMINE HCL 50 MG/ML IJ SOLN
25.0000 mg | Freq: Once | INTRAMUSCULAR | Status: AC
  Administered 2015-08-22: 25 mg via INTRAVENOUS
  Filled 2015-08-22: qty 1

## 2015-08-22 MED ORDER — SALINE SPRAY 0.65 % NA SOLN
1.0000 | Freq: Once | NASAL | Status: AC
  Administered 2015-08-22: 1 via NASAL
  Filled 2015-08-22: qty 44

## 2015-08-22 NOTE — ED Notes (Signed)
PA Jen at bedside.  

## 2015-08-22 NOTE — Discharge Instructions (Signed)
Please follow up with your primary care physician in 1-2 days. If you do not have one please call the [facility] number listed above.Please read all discharge instructions and return precautions.    Allergies Allergies may happen from anything your body is sensitive to. This may be food, medicines, pollens, chemicals, and nearly anything around you in everyday life that produces allergens. An allergen is anything that causes an allergy producing substance. Heredity is often a factor in causing these problems. This means you may have some of the same allergies as your parents. Food allergies happen in all age groups. Food allergies are some of the most severe and life threatening. Some common food allergies are cow's milk, seafood, eggs, nuts, wheat, and soybeans. SYMPTOMS   Swelling around the mouth.  An itchy red rash or hives.  Vomiting or diarrhea.  Difficulty breathing. SEVERE ALLERGIC REACTIONS ARE LIFE-THREATENING. This reaction is called anaphylaxis. It can cause the mouth and throat to swell and cause difficulty with breathing and swallowing. In severe reactions only a trace amount of food (for example, peanut oil in a salad) may cause death within seconds. Seasonal allergies occur in all age groups. These are seasonal because they usually occur during the same season every year. They may be a reaction to molds, grass pollens, or tree pollens. Other causes of problems are house dust mite allergens, pet dander, and mold spores. The symptoms often consist of nasal congestion, a runny itchy nose associated with sneezing, and tearing itchy eyes. There is often an associated itching of the mouth and ears. The problems happen when you come in contact with pollens and other allergens. Allergens are the particles in the air that the body reacts to with an allergic reaction. This causes you to release allergic antibodies. Through a chain of events, these eventually cause you to  release histamine into the blood stream. Although it is meant to be protective to the body, it is this release that causes your discomfort. This is why you were given anti-histamines to feel better. If you are unable to pinpoint the offending allergen, it may be determined by skin or blood testing. Allergies cannot be cured but can be controlled with medicine. Hay fever is a collection of all or some of the seasonal allergy problems. It may often be treated with simple over-the-counter medicine such as diphenhydramine. Take medicine as directed. Do not drink alcohol or drive while taking this medicine. Check with your caregiver or package insert for child dosages. If these medicines are not effective, there are many new medicines your caregiver can prescribe. Stronger medicine such as nasal spray, eye drops, and corticosteroids may be used if the first things you try do not work well. Other treatments such as immunotherapy or desensitizing injections can be used if all else fails. Follow up with your caregiver if problems continue. These seasonal allergies are usually not life threatening. They are generally more of a nuisance that can often be handled using medicine. HOME CARE INSTRUCTIONS   If unsure what causes a reaction, keep a diary of foods eaten and symptoms that follow. Avoid foods that cause reactions.  If hives or rash are present:  Take medicine as directed.  You may use an over-the-counter antihistamine (diphenhydramine) for hives and itching as needed.  Apply cold compresses (cloths) to the skin or take baths in cool water. Avoid hot baths or showers. Heat will make a rash and itching worse.  If you are severely allergic:  Following a treatment for a severe reaction, hospitalization is often required for closer follow-up.  Wear a medic-alert bracelet or necklace stating the allergy.  You and your family must learn how to give adrenaline or use an anaphylaxis kit.  If you have  had a severe reaction, always carry your anaphylaxis kit or EpiPen with you. Use this medicine as directed by your caregiver if a severe reaction is occurring. Failure to do so could have a fatal outcome. SEEK MEDICAL CARE IF:  You suspect a food allergy. Symptoms generally happen within 30 minutes of eating a food.  Your symptoms have not gone away within 2 days or are getting worse.  You develop new symptoms.  You want to retest yourself or your child with a food or drink you think causes an allergic reaction. Never do this if an anaphylactic reaction to that food or drink has happened before. Only do this under the care of a caregiver. SEEK IMMEDIATE MEDICAL CARE IF:   You have difficulty breathing, are wheezing, or have a tight feeling in your chest or throat.  You have a swollen mouth, or you have hives, swelling, or itching all over your body.  You have had a severe reaction that has responded to your anaphylaxis kit or an EpiPen. These reactions may return when the medicine has worn off. These reactions should be considered life threatening. MAKE SURE YOU:   Understand these instructions.  Will watch your condition.  Will get help right away if you are not doing well or get worse. Document Released: 02/13/2003 Document Revised: 03/17/2013 Document Reviewed: 07/20/2008 [facility] Patient Information 2015 [place], [place]. This information is not intended to replace advice given to you by your health care provider. Make sure you discuss any questions you have with your health care provider.  Allergic Conjunctivitis The conjunctiva is a thin membrane that covers the visible white part of the eyeball and the underside of the eyelids. This membrane protects and lubricates the eye. The membrane has small blood vessels running through it that can normally be seen. When the conjunctiva becomes inflamed, the condition is called conjunctivitis. In response to the inflammation, the  conjunctival blood vessels become swollen. The swelling results in redness in the normally white part of the eye. The blood vessels of this membrane also react when a person has allergies and is then called allergic conjunctivitis. This condition usually lasts for as long as the allergy persists. Allergic conjunctivitis cannot be passed to another person (non-contagious). The likelihood of bacterial infection is great and the cause is not likely due to allergies if the inflamed eye has:  A sticky discharge.  Discharge or sticking together of the lids in the morning.  Scaling or flaking of the eyelids where the eyelashes come out.  Red swollen eyelids. CAUSES   Viruses.  Irritants such as foreign bodies.  Chemicals.  General allergic reactions.  Inflammation or serious diseases in the inside or the outside of the eye or the orbit (the boney cavity in which the eye sits) can cause a "red eye." SYMPTOMS   Eye redness.  Tearing.  Itchy eyes.  Burning feeling in the eyes.  Clear drainage from the eye.  Allergic reaction due to pollens or ragweed sensitivity. Seasonal allergic conjunctivitis is frequent in the spring when pollens are in the air and in the fall. DIAGNOSIS  This condition, in its many forms, is usually diagnosed based on the history and an ophthalmological exam. It usually involves both eyes.  If your eyes react at the same time every year, allergies may be the cause. While most "red eyes" are due to allergy or an infection, the role of an eye (ophthalmological) exam is important. The exam can rule out serious diseases of the eye or orbit. TREATMENT   Non-antibiotic eye drops, ointments, or medications by mouth may be prescribed if the ophthalmologist is sure the conjunctivitis is due to allergies alone.  Over-the-counter drops and ointments for allergic symptoms should be used only after other causes of conjunctivitis have been ruled out, or as your caregiver  suggests. Medications by mouth are often prescribed if other allergy-related symptoms are present. If the ophthalmologist is sure that the conjunctivitis is due to allergies alone, treatment is normally limited to drops or ointments to reduce itching and burning. HOME CARE INSTRUCTIONS   Wash hands before and after applying drops or ointments, or touching the inflamed eye(s) or eyelids.  Do not let the eye dropper tip or ointment tube touch the eyelid when putting medicine in your eye.  Stop using your soft contact lenses and throw them away. Use a new pair of lenses when recovery is complete. You should run through sterilizing cycles at least three times before use after complete recovery if the old soft contact lenses are to be used. Hard contact lenses should be stopped. They need to be thoroughly sterilized before use after recovery.  Itching and burning eyes due to allergies is often relieved by using a cool cloth applied to closed eye(s). SEEK MEDICAL CARE IF:   Your problems do not go away after two or three days of treatment.  Your lids are sticky (especially in the morning when you wake up) or stick together.  Discharge develops. Antibiotics may be needed either as drops, ointment, or by mouth.  You have extreme light sensitivity.  An oral temperature above 102 F (38.9 C) develops.  Pain in or around the eye or any other visual symptom develops. MAKE SURE YOU:   Understand these instructions.  Will watch your condition.  Will get help right away if you are not doing well or get worse. Document Released: 02/10/2003 Document Revised: 02/12/2012 Document Reviewed: 01/06/2008 [facility] Patient Information 2015 [place], [place]. This information is not intended to replace advice given to you by your health care provider. Make sure you discuss any questions you have with your health care provider.

## 2015-08-22 NOTE — ED Provider Notes (Signed)
CSN: [id number]     Arrival date & time 08/21/15  2243 History   First MD Initiated Contact with Patient 08/22/15 0200     Chief Complaint  Patient presents with  . Cough  . Nasal Congestion  . Allergic Reaction     (Consider location/radiation/quality/duration/timing/severity/associated sxs/prior Treatment) HPI Comments: Patient is a 39 yo F presenting to the ED for two complaints. The patient's first complaint is 2 weeks of non-productive cough, rhinorrhea, itchy tearing eyes. She has tried Benadryl intermittently with no relief. Patient states Friday she ate shrimp and then developed hand itching, she awoke this morning with hives all over her body, excluding her head. She did not try any Benadryl. She denies any SOB, vomiting, diarrhea, abdominal pain, sensation of throat swelling or closing, lip or tongue swelling. No known allergies. No known new foods, detergents, soaps etc.    Past Medical History  Diagnosis Date  . No pertinent past medical history    Past Surgical History  Procedure Laterality Date  . No past surgeries     Family History  Problem Relation Age of Onset  . Diabetes Father   . Cancer Maternal Aunt     colon  . Diabetes Maternal Aunt   . Diabetes Maternal Uncle   . Diabetes Paternal Uncle   . Diabetes Maternal Grandmother   . Cancer Maternal Grandfather     prostrate  . Diabetes Paternal Grandfather   . Anesthesia problems Neg Hx   . Hypotension Neg Hx   . Malignant hyperthermia Neg Hx   . Pseudochol deficiency Neg Hx    Social History  Substance Use Topics  . Smoking status: Never Smoker   . Smokeless tobacco: Never Used  . Alcohol Use: No   OB History    Gravida Para Term Preterm AB TAB SAB Ectopic Multiple Living   0 0 0 0 0 0 3     Review of Systems  HENT: Positive for congestion, rhinorrhea and sneezing.   Eyes: Positive for discharge and itching.  Respiratory: Positive for cough.   Skin: Positive for rash.  All other systems  reviewed and are negative.     Allergies  Review of patient's allergies indicates no known allergies.  Home Medications   Prior to Admission medications   Medication Sig Start Date End Date Taking? Authorizing Jaslene Marsteller  acetaminophen (TYLENOL) 500 MG tablet Take 500 mg by mouth every 6 (six) hours as needed. For headache     Historical Charnetta Wulff, MD  diphenhydrAMINE (BENADRYL) 25 MG tablet Take 1 tablet (25 mg total) by mouth every 6 (six) hours as needed for itching (Rash). 08/22/15   Jennifer Piepenbrink, PA-C  famotidine (PEPCID) 20 MG tablet Take 1 tablet (20 mg total) by mouth 2 (two) times daily. 09/13/11 09/12/12  Lucina Mellow, DO  famotidine (PEPCID) 20 MG tablet Take 1 tablet (20 mg total) by mouth 2 (two) times daily as needed (allergies). 08/22/15   Francee Piccolo, PA-C  ferrous sulfate 325 (65 FE) MG tablet Take 1 tablet (325 mg total) by mouth daily with breakfast. 10/11/11 10/10/12  Durwin Reges, MD  fluticasone (FLONASE) 50 MCG/ACT nasal spray Place 2 sprays into the nose daily. 03/06/12 03/06/13  Delanna Notice, MD   BP 105/68 mmHg  Pulse 96  Temp(Src) 98.8 F (37.1 C) (Oral)  Resp 16  SpO2 99% Physical Exam  Constitutional: She is oriented to person, place, and time. She appears well-developed and well-nourished. No distress.  HENT:  Head: Normocephalic and atraumatic.  Right Ear: External ear normal.  Left Ear: External ear normal.  Nose: Nose normal.  Mouth/Throat: Oropharynx is clear and moist. No oropharyngeal exudate.  No angioedema. No intraoral swelling.   Eyes: Conjunctivae and EOM are normal. Pupils are equal, round, and reactive to light. Right eye exhibits discharge (watery). Left eye exhibits discharge (watery).  Neck: Neck supple.  Cardiovascular: Normal rate, regular rhythm and normal heart sounds.   Pulmonary/Chest: Effort normal and breath sounds normal. She has no rales.  Abdominal: Soft. There is no tenderness.  Musculoskeletal: Normal range  of motion.  Neurological: She is alert and oriented to person, place, and time.  Skin: Skin is warm and dry. Rash (urticaria to full body excluding face) noted. She is not diaphoretic.  Nursing note and vitals reviewed.   ED Course  Procedures (including critical care time) Medications  diphenhydrAMINE (BENADRYL) injection 25 mg (25 mg Intravenous Given 08/22/15 0250)  dexamethasone (DECADRON) injection 10 mg (10 mg Intravenous Given 08/22/15 0250)  famotidine (PEPCID) IVPB 20 mg premix (0 mg Intravenous Stopped 08/22/15 0353)  sodium chloride (OCEAN) 0.65 % nasal spray 1 spray (1 spray Each Nare Given 08/22/15 0251)   Labs Review Labs Reviewed - No data to display  Imaging Review Dg Chest 2 View  08/22/2015   CLINICAL DATA:  Cough for 2 weeks.  EXAM: CHEST  2 VIEW  COMPARISON:  06/10/2011  FINDINGS: The cardiomediastinal contours are normal. The lungs are clear. Pulmonary vasculature is normal. No consolidation, pleural effusion, or pneumothorax. No acute osseous abnormalities are seen.  IMPRESSION: No acute pulmonary process.   Electronically Signed   By: Rubye Oaks M.D.   On: 08/22/2015 03:52   I have personally reviewed and evaluated these images and lab results as part of my medical decision-making.   EKG Interpretation None      MDM   Final diagnoses:  Allergic conjunctivitis and rhinitis, bilateral  Allergic reaction, initial encounter   Filed Vitals:   08/22/15 0434  BP: 105/68  Pulse: 96  Temp:   Resp: 16   Afebrile, NAD, non-toxic appearing, AAOx4.   1) Allergic Rxn: Patient re-evaluated prior to dc, is hemodynamically stable, in no respiratory distress, and denies the feeling of throat closing. Pt has been advised to take OTC benadryl & return to the ED if they have a mod-severe allergic rxn (s/s including throat closing, difficulty breathing, swelling of lips face or tongue).   2) Allergic rhinoconjunctivitis: Symptoms consistent with allergic  rhinoconjunctivitis. Will treat symptomatically.   Pt is to follow up with their PCP. Pt is agreeable with plan & verbalizes understanding.   Francee Piccolo, PA-C 08/22/15 1939  Loren Racer, MD 08/31/15 2259

## 2015-08-23 ENCOUNTER — Encounter (HOSPITAL_COMMUNITY): Payer: Self-pay | Admitting: Emergency Medicine

## 2015-08-23 ENCOUNTER — Emergency Department (HOSPITAL_COMMUNITY)
Admission: EM | Admit: 2015-08-23 | Discharge: 2015-08-24 | Disposition: A | Discharge status: Home / Self Care | Payer: BLUE CROSS/BLUE SHIELD | Attending: Emergency Medicine | Admitting: Emergency Medicine

## 2015-08-23 DIAGNOSIS — L509 Urticaria, unspecified: Secondary | ICD-10-CM | POA: Insufficient documentation

## 2015-08-23 DIAGNOSIS — R21 Rash and other nonspecific skin eruption: Secondary | ICD-10-CM | POA: Diagnosis present

## 2015-08-23 LAB — CBC WITH DIFFERENTIAL/PLATELET
BASOS ABS: 0 10*3/uL (ref 0.0–0.1)
Basophils Relative: 0 %
Eosinophils Absolute: 0 10*3/uL (ref 0.0–0.7)
Eosinophils Relative: 1 %
HEMATOCRIT: 37.7 % (ref 36.0–46.0)
Hemoglobin: 12.8 g/dL (ref 12.0–15.0)
LYMPHS ABS: 2.7 10*3/uL (ref 0.7–4.0)
LYMPHS PCT: 31 %
MCH: 30.1 pg (ref 26.0–34.0)
MCHC: 34 g/dL (ref 30.0–36.0)
MCV: 88.7 fL (ref 78.0–100.0)
MONO ABS: 0.4 10*3/uL (ref 0.1–1.0)
MONOS PCT: 4 %
NEUTROS ABS: 5.5 10*3/uL (ref 1.7–7.7)
Neutrophils Relative %: 64 %
Platelets: 368 10*3/uL (ref 150–400)
RBC: 4.25 MIL/uL (ref 3.87–5.11)
RDW: 12.8 % (ref 11.5–15.5)
WBC: 8.5 10*3/uL (ref 4.0–10.5)

## 2015-08-23 LAB — PROTIME-INR
INR: 1.08 (ref 0.00–1.49)
Prothrombin Time: 14.2 seconds (ref 11.6–15.2)

## 2015-08-23 MED ORDER — DIPHENHYDRAMINE HCL 50 MG/ML IJ SOLN
25.0000 mg | Freq: Once | INTRAMUSCULAR | Status: AC
  Administered 2015-08-23: 25 mg via INTRAVENOUS
  Filled 2015-08-23: qty 1

## 2015-08-23 MED ORDER — FAMOTIDINE IN NACL 20-0.9 MG/50ML-% IV SOLN
20.0000 mg | Freq: Once | INTRAVENOUS | Status: AC
  Administered 2015-08-23: 20 mg via INTRAVENOUS
  Filled 2015-08-23: qty 50

## 2015-08-23 MED ORDER — METHYLPREDNISOLONE SODIUM SUCC 125 MG IJ SOLR
125.0000 mg | Freq: Once | INTRAMUSCULAR | Status: AC
  Administered 2015-08-23: 125 mg via INTRAVENOUS
  Filled 2015-08-23: qty 2

## 2015-08-23 MED ORDER — IPRATROPIUM-ALBUTEROL 0.5-2.5 (3) MG/3ML IN SOLN
3.0000 mL | Freq: Once | RESPIRATORY_TRACT | Status: AC
  Administered 2015-08-24: 3 mL via RESPIRATORY_TRACT
  Filled 2015-08-23: qty 3

## 2015-08-23 NOTE — ED Notes (Addendum)
Pt from home for eval of swelling and rash to entire body, pt states was just seen recently for allergic reaction and was given benadryl.  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 Denies any sob or cp at this time. Minor swelling to bottom right lip. Pt able to speak in complete sentences at this time. Lung sounds clear

## 2015-08-23 NOTE — ED Provider Notes (Signed)
CSN: [id number]     Arrival date & time 08/23/15  2037 History   First MD Initiated Contact with Patient 08/23/15 2257     Chief Complaint  Patient presents with  . Allergic Reaction     (Consider location/radiation/quality/duration/timing/severity/associated sxs/prior Treatment) HPI Comments: 39 year old female with no significant past medical history presents to the emergency department for further evaluation of a rash. Patient was seen 48 hours ago for evaluation of the same. Patient states that her symptoms have never resolved, and he began worsening today. Patient reports taking up from a nap and noticing swelling to her lip and face. This has slightly improved since taking oral Benadryl prior to arrival. Patient states that the rash has also spread to her bilateral lower extremities and feet. She has had some associated swelling in her feet. Patient denies any chest pain or shortness of breath. She has had no drooling, inability to swallow, or wheezing. Patient denies any known tick bites or tick exposure. No fevers. Patient denies the use of antibiotics recently. No exposure to persons with similar rash. Patient did begin working as a Engineer, water part time with harsh chemicals, but she has not been back to this job since her initial ED evaluation.  Patient is a 39 y.o. female presenting with allergic reaction. The history is provided by the patient. No language interpreter was used.  Allergic Reaction Presenting symptoms: rash   Presenting symptoms: no difficulty swallowing and no wheezing     Past Medical History  Diagnosis Date  . No pertinent past medical history    Past Surgical History  Procedure Laterality Date  . No past surgeries     Family History  Problem Relation Age of Onset  . Diabetes Father   . Cancer Maternal Aunt     colon  . Diabetes Maternal Aunt   . Diabetes Maternal Uncle   . Diabetes Paternal Uncle   . Diabetes Maternal Grandmother   . Cancer Maternal  Grandfather     prostrate  . Diabetes Paternal Grandfather   . Anesthesia problems Neg Hx   . Hypotension Neg Hx   . Malignant hyperthermia Neg Hx   . Pseudochol deficiency Neg Hx    Social History  Substance Use Topics  . Smoking status: Never Smoker   . Smokeless tobacco: Never Used  . Alcohol Use: No   OB History    Gravida Para Term Preterm AB TAB SAB Ectopic Multiple Living   0 0 0 0 0 0 3      Review of Systems  Constitutional: Negative for fever.  HENT: Positive for facial swelling. Negative for sore throat and trouble swallowing.   Respiratory: Negative for shortness of breath and wheezing.   Gastrointestinal: Negative for nausea and vomiting.  Skin: Positive for rash.  Neurological: Negative for weakness and numbness.  All other systems reviewed and are negative.   Allergies  Review of patient's allergies indicates no known allergies.  Home Medications   Prior to Admission medications   Medication Sig Start Date End Date Taking? Authorizing Provider  acetaminophen (TYLENOL) 500 MG tablet Take 500 mg by mouth every 6 (six) hours as needed. For headache    Yes Historical Provider, MD  diphenhydrAMINE (BENADRYL) 25 MG tablet Take 1 tablet (25 mg total) by mouth every 6 (six) hours as needed for itching (Rash). 08/22/15  Yes Jennifer Piepenbrink, PA-C  famotidine (PEPCID) 20 MG tablet Take 1 tablet (20 mg total) by mouth 2 (two) times  daily as needed (allergies). 08/22/15  Yes Jennifer Piepenbrink, PA-C  predniSONE (DELTASONE) 20 MG tablet Take 2 tablets (40 mg total) by mouth daily. Take 40 mg by mouth daily for 6 days, then  by mouth daily for 6 days, then  daily for 6 days 08/24/15   Antony Madura, PA-C   BP 107/63 mmHg  Pulse 93  Temp(Src) 98.2 F (36.8 C) (Oral)  Resp 14  Ht  (1.6 m)  Wt 141 lb (63.957 kg)  BMI 24.98 kg/m2  SpO2 100%   Physical Exam  Constitutional: She is oriented to person, place, and time. She appears well-developed and  well-nourished. No distress.  Nontoxic/nonseptic appearing  HENT:  Head: Normocephalic and atraumatic.  Mouth/Throat: Oropharynx is clear and moist. No oropharyngeal exudate.  No angioedema. No oral lesions. Uvula midline. Patient tolerating secretions without difficulty; speaking in full sentences  Eyes: Conjunctivae and EOM are normal. No scleral icterus.  Neck: Normal range of motion.  No stridor  Cardiovascular: Normal rate, regular rhythm and intact distal pulses.   Pulmonary/Chest: Effort normal. No respiratory distress. She has no wheezes.  Respirations even and unlabored  Musculoskeletal: Normal range of motion.  Neurological: She is alert and oriented to person, place, and time. She exhibits normal muscle tone. Coordination normal.  GCS 15. Sensation to light touch intact. Patient ambulatory with steady gait.  Skin: Skin is warm and dry. Rash noted. She is not diaphoretic. No erythema. No pallor.  Slightly raised, macular, pruritic, blanching erythematous rash noted to chest, b/l upper extremities, and cheeks. There is also a flat, macular, erythematous, non-blanching rash to the b/l lower extremities and dorsal feet b/l. Also pruritic. No skin peeling, weeping, drainage, or induration.  Psychiatric: She has a normal mood and affect. Her behavior is normal.  Nursing note and vitals reviewed.   ED Course  Procedures (including critical care time) Labs Review Labs Reviewed  COMPREHENSIVE METABOLIC PANEL - Abnormal; Notable for the following:    CO2 21 (*)    Calcium 8.4 (*)    Total Protein 6.2 (*)    Total Bilirubin 1.7 (*)    All other components within normal limits  CBC WITH DIFFERENTIAL/PLATELET  PROTIME-INR  SEDIMENTATION RATE    Imaging Review Dg Chest 2 View  08/22/2015   CLINICAL DATA:  Cough for 2 weeks.  EXAM: CHEST  2 VIEW  COMPARISON:  06/10/2011  FINDINGS: The cardiomediastinal contours are normal. The lungs are clear. Pulmonary vasculature is normal. No  consolidation, pleural effusion, or pneumothorax. No acute osseous abnormalities are seen.  IMPRESSION: No acute pulmonary process.   Electronically Signed   By: Rubye Oaks M.D.   On: 08/22/2015 03:52   I have personally reviewed and evaluated these images and lab results as part of my medical decision-making.   EKG Interpretation None      Medications  albuterol (PROVENTIL HFA;VENTOLIN HFA) 108 (90 BASE) MCG/ACT inhaler 2 puff (not administered)  methylPREDNISolone sodium succinate (SOLU-MEDROL) 125 mg/2 mL injection 125 mg (125 mg Intravenous Given 08/23/15 2343)  famotidine (PEPCID) IVPB 20 mg premix (0 mg Intravenous Stopped 08/24/15 0007)  diphenhydrAMINE (BENADRYL) injection 25 mg (25 mg Intravenous Given 08/23/15 2343)  ipratropium-albuterol (DUONEB) 0.5-2.5 (3) MG/3ML nebulizer solution 3 mL (3 mLs Nebulization Given 08/24/15 0007)    MDM   Final diagnoses:  Urticaria    Patient presents to the emergency department for persistent urticaria. Patient with urticarial rash to her trunk and upper extremities. There is a purpuric/telangectasia type  rash to the b/l lower extremities, thought to be secondary to edema. Laboratory workup is noncontributory. Symptoms tx with Benadryl, Pepcid, and Solu-Medrol. Patient has been taking Benadryl and Pepcid as outpatient. Will add steroid taper.  Patient with no angioedema on exam. No wheezing or stridor. She denies sensation of her throat closing. She further denies inability to swallow or drooling. Patient has no hypoxia or signs of respiratory distress today. Symptoms stable since arrival 4 hours ago. No indication for further emergent workup at this time. Will refer to primary care for recheck. Return precautions discussed and provided. Patient agreeable to plan with no unaddressed concerns. Patient discharged in good condition.   Filed Vitals:   08/23/15 2314 08/23/15 2330 08/23/15 2345 08/24/15 0015  BP: 116/71 119/66 127/67 107/63   Pulse: 88 93 102 93  Temp: 98.2 F (36.8 C)     TempSrc: Oral     Resp: Height:      Weight:      SpO2: 100% 100% 100% 100%     Antony Madura, PA-C 08/24/15 0038  Arby Barrette, MD 08/31/15 [phone]

## 2015-08-24 LAB — COMPREHENSIVE METABOLIC PANEL
ALBUMIN: 3.7 g/dL (ref 3.5–5.0)
ALT: 14 U/L (ref 14–54)
AST: 27 U/L (ref 15–41)
Alkaline Phosphatase: 46 U/L (ref 38–126)
Anion gap: 10 (ref 5–15)
BILIRUBIN TOTAL: 1.7 mg/dL — AB (ref 0.3–1.2)
BUN: 15 mg/dL (ref 6–20)
CO2: 21 mmol/L — AB (ref 22–32)
Calcium: 8.4 mg/dL — ABNORMAL LOW (ref 8.9–10.3)
Chloride: 105 mmol/L (ref 101–111)
Creatinine, Ser: 0.89 mg/dL (ref 0.44–1.00)
GFR calc Af Amer: >60 mL/min (ref >60)
GFR calc non Af Amer: >60 mL/min (ref >60)
GLUCOSE: 91 mg/dL (ref 65–99)
POTASSIUM: 4.4 mmol/L (ref 3.5–5.1)
Sodium: 136 mmol/L (ref 135–145)
TOTAL PROTEIN: 6.2 g/dL — AB (ref 6.5–8.1)

## 2015-08-24 LAB — SEDIMENTATION RATE: Sed Rate: 33 mm/hr — ABNORMAL HIGH (ref 0–22)

## 2015-08-24 MED ORDER — ALBUTEROL SULFATE HFA 108 (90 BASE) MCG/ACT IN AERS
2.0000 | INHALATION_SPRAY | Freq: Once | RESPIRATORY_TRACT | Status: AC
  Administered 2015-08-24: 2 via RESPIRATORY_TRACT
  Filled 2015-08-24: qty 6.7

## 2015-08-24 MED ORDER — PREDNISONE 20 MG PO TABS: 40.0000 mg | ORAL_TABLET | Freq: Every day | ORAL | Status: DC

## 2015-08-24 MED ORDER — HYDROXYZINE HCL 25 MG PO TABS: 25.0000 mg | ORAL_TABLET | Freq: Four times a day (QID) | ORAL | Status: DC

## 2015-08-24 NOTE — ED Notes (Signed)
Patient left at this time with all belongings. 

## 2015-08-24 NOTE — Discharge Instructions (Signed)
Take prednisone as prescribed for symptoms. Use an albuterol inhaler, 2 puffs every 4-6 hours, as needed for cough/shortness of breath. Continue taking Pepcid daily. Take Benadryl as needed for continued itching/rash. Follow up with a primary care doctor for symptom recheck.  Hives Hives are itchy, red, swollen areas of the skin. They can vary in size and location on your body. Hives can come and go for hours or several days (acute hives) or for several weeks (chronic hives). Hives do not spread from person to person (noncontagious). They may get worse with scratching, exercise, and emotional stress. CAUSES   Allergic reaction to food, additives, or drugs.  Infections, including the common cold.  Illness, such as vasculitis, lupus, or thyroid disease.  Exposure to sunlight, heat, or cold.  Exercise.  Stress.  Contact with chemicals. SYMPTOMS   Red or white swollen patches on the skin. The patches may change size, shape, and location quickly and repeatedly.  Itching.  Swelling of the hands, feet, and face. This may occur if hives develop deeper in the skin. DIAGNOSIS  Your caregiver can usually tell what is wrong by performing a physical exam. Skin or blood tests may also be done to determine the cause of your hives. In some cases, the cause cannot be determined. TREATMENT  Mild cases usually get better with medicines such as antihistamines. Severe cases may require an emergency epinephrine injection. If the cause of your hives is known, treatment includes avoiding that trigger.  HOME CARE INSTRUCTIONS   Avoid causes that trigger your hives.  Take antihistamines as directed by your caregiver to reduce the severity of your hives. Non-sedating or low-sedating antihistamines are usually recommended. Do not drive while taking an antihistamine.  Take any other medicines prescribed for itching as directed by your caregiver.  Wear loose-fitting clothing.  Keep all follow-up  appointments as directed by your caregiver. SEEK MEDICAL CARE IF:   You have persistent or severe itching that is not relieved with medicine.  You have painful or swollen joints. SEEK IMMEDIATE MEDICAL CARE IF:   You have a fever.  Your tongue or lips are swollen.  You have trouble breathing or swallowing.  You feel tightness in the throat or chest.  You have abdominal pain. These problems may be the first sign of a life-threatening allergic reaction. Call your local emergency services (911 in [place] MAKE SURE YOU:   Understand these instructions.  Will watch your condition.  Will get help right away if you are not doing well or get worse. Document Released: 11/20/2005 Document Revised: 11/25/2013 Document Reviewed: 02/13/2012 [facility] Patient Information 2015 [place], [place]. This information is not intended to replace advice given to you by your health care provider. Make sure you discuss any questions you have with your health care provider.

## 2016-03-22 ENCOUNTER — Other Ambulatory Visit: Payer: Self-pay

## 2016-03-22 DIAGNOSIS — Z803 Family history of malignant neoplasm of breast: Secondary | ICD-10-CM

## 2016-03-22 DIAGNOSIS — Z1231 Encounter for screening mammogram for malignant neoplasm of breast: Secondary | ICD-10-CM

## 2016-04-03 ENCOUNTER — Ambulatory Visit
Admission: RE | Admit: 2016-04-03 | Discharge: 2016-04-03 | Disposition: A | Discharge status: Home / Self Care | Payer: BLUE CROSS/BLUE SHIELD | Source: Ambulatory Visit

## 2016-04-03 DIAGNOSIS — Z1231 Encounter for screening mammogram for malignant neoplasm of breast: Secondary | ICD-10-CM

## 2016-04-03 DIAGNOSIS — Z803 Family history of malignant neoplasm of breast: Secondary | ICD-10-CM

## 2019-04-25 ENCOUNTER — Ambulatory Visit: Payer: BLUE CROSS/BLUE SHIELD | Admitting: Podiatry

## 2019-04-25 ENCOUNTER — Ambulatory Visit (INDEPENDENT_AMBULATORY_CARE_PROVIDER_SITE_OTHER): Payer: BLUE CROSS/BLUE SHIELD

## 2019-04-25 ENCOUNTER — Other Ambulatory Visit: Payer: Self-pay

## 2019-04-25 ENCOUNTER — Encounter: Payer: Self-pay | Admitting: Podiatry

## 2019-04-25 VITALS — Temp 97.7°F

## 2019-04-25 DIAGNOSIS — L600 Ingrowing nail: Secondary | ICD-10-CM | POA: Diagnosis not present

## 2019-04-25 DIAGNOSIS — M79671 Pain in right foot: Secondary | ICD-10-CM | POA: Diagnosis not present

## 2019-04-25 DIAGNOSIS — M79672 Pain in left foot: Secondary | ICD-10-CM | POA: Diagnosis not present

## 2019-04-25 DIAGNOSIS — Z79899 Other long term (current) drug therapy: Secondary | ICD-10-CM | POA: Diagnosis not present

## 2019-04-25 DIAGNOSIS — M722 Plantar fascial fibromatosis: Secondary | ICD-10-CM | POA: Diagnosis not present

## 2019-04-25 DIAGNOSIS — B353 Tinea pedis: Secondary | ICD-10-CM

## 2019-04-25 MED ORDER — METHYLPREDNISOLONE 4 MG PO TBPK: ORAL_TABLET | ORAL | 0 refills | Status: AC

## 2019-04-25 MED ORDER — MELOXICAM 15 MG PO TABS: 15.0000 mg | ORAL_TABLET | Freq: Every day | ORAL | 0 refills | Status: AC

## 2019-04-25 NOTE — Patient Instructions (Signed)

## 2019-04-26 LAB — CBC WITH DIFFERENTIAL/PLATELET
Absolute Monocytes: 824 cells/uL (ref 200–950)
Basophils Absolute: 43 cells/uL (ref 0–200)
Basophils Relative: 0.6 %
Eosinophils Absolute: 128 cells/uL (ref 15–500)
Eosinophils Relative: 1.8 %
HCT: 39.4 % (ref 35.0–45.0)
Hemoglobin: 12.9 g/dL (ref 11.7–15.5)
Lymphs Abs: 2854 cells/uL (ref 850–3900)
MCH: 28.5 pg (ref 27.0–33.0)
MCHC: 32.7 g/dL (ref 32.0–36.0)
MCV: 87.2 fL (ref 80.0–100.0)
MPV: 9.5 fL (ref 7.5–12.5)
Monocytes Relative: 11.6 %
Neutro Abs: 3252 cells/uL (ref 1500–7800)
Neutrophils Relative %: 45.8 %
Platelets: 362 10*3/uL (ref 140–400)
RBC: 4.52 10*6/uL (ref 3.80–5.10)
RDW: 12.5 % (ref 11.0–15.0)
Total Lymphocyte: 40.2 %
WBC: 7.1 10*3/uL (ref 3.8–10.8)

## 2019-04-26 LAB — HEPATIC FUNCTION PANEL
AG Ratio: 1.7 (calc) (ref 1.0–2.5)
ALT: 16 U/L (ref 6–29)
AST: 16 U/L (ref 10–30)
Albumin: 4.4 g/dL (ref 3.6–5.1)
Alkaline phosphatase (APISO): 61 U/L (ref 31–125)
Bilirubin, Direct: 0.1 mg/dL (ref 0.0–0.2)
Globulin: 2.6 g/dL (calc) (ref 1.9–3.7)
Indirect Bilirubin: 0.4 mg/dL (calc) (ref 0.2–1.2)
Total Bilirubin: 0.5 mg/dL (ref 0.2–1.2)
Total Protein: 7 g/dL (ref 6.1–8.1)

## 2019-04-30 NOTE — Progress Notes (Signed)
Subjective:   Patient ID: Olivia Abbott, female   DOB: 43 y.o.   MRN: [id number]   HPI 43 year old female presents the office today for multiple concerns.  She is having concerned she is getting pain to the arch and heel on both feet.  She states that she gets pain most in the morning when she first gets up and is better with activity.  She states that she had injections performed previously which was not helpful and she feels this may made her feet hurt more.  She denies any recent injury or trauma to her feet.  No swelling or redness.  Numbness or tingling.  She also has secondary concerns possibly athlete's foot as well as discoloration to her toenails.  She has no significant pain to the nails except for she does get ingrown toenails on both of the toes.  She currently denies any redness or drainage or any swelling.  She said no recent treatment for this.   Review of Systems  All other systems reviewed and are negative.  Past Medical History:  Diagnosis Date  . No pertinent past medical history     Past Surgical History:  Procedure Laterality Date  . NO PAST SURGERIES       Current Outpatient Medications:  .  meloxicam (MOBIC) 15 MG tablet, Take 1 tablet (15 mg total) by mouth daily., Disp: 14 tablet, Rfl: 0 .  methylPREDNISolone (MEDROL DOSEPAK) 4 MG TBPK tablet, Take as directed, Disp: 21 tablet, Rfl: 0  Current Facility-Administered Medications:  .  influenza  inactive virus vaccine (FLUZONE/FLUARIX) injection 0.5 mL, 0.5 mL, Intramuscular, Once, Adam PhenixArnold, James G, MD  No Known Allergies      Objective:  Physical Exam  General: AAO x3, NAD  Dermatological: Overall nails are mildly dystrophic with yellow and white discoloration of the nails.  There is mild incurvation present to bilateral hallux toenails there is no edema, erythema, drainage or possibly signs of infection of the toenail sites.  Mild dry, erythematous skin interdigitally as well as the plantar aspects of any  skin openings or blistering.  There is no open lesions.  Vascular: Dorsalis Pedis artery and Posterior Tibial artery pedal pulses are 2/4 bilateral with immedate capillary fill time. There is no pain with calf compression, swelling, warmth, erythema.   Neruologic: Grossly intact via light touch bilateral.Protective threshold with Semmes Wienstein monofilament intact to all pedal sites bilateral. Negative Tinel sign.  Musculoskeletal: There is tenderness palpation on the plantar medial tubercle of the calcaneus at the insertion of plantar fascial as well as to the medial band within the arch of the foot.  There is no pain with lateral compression of calcaneus.  There is no pain on the course or insertion of Achilles tendon.  No edema, erythema.  No other areas of tenderness elicited at this time.  Range of motion intact.  Muscular strength 5/5 in all groups tested bilateral.  Gait: Unassisted, Nonantalgic.       Assessment:   Bilateral foot pain, likely plantar fasciitis; onychomycosis/tinea pedis; ingrown toenails    Plan:  -Treatment options discussed including all alternatives, risks, and complications -Etiology of symptoms were discussed -X-rays were obtained and reviewed with the patient. There is no evidence of acute fracture or stress fracture identified today. -She does not want proceed with any further steroid injections.  Prescribed a Medrol Dosepak.  Once this is complete she can use meloxicam discussed side effects of both medications.  We discussed traction, ice and exercises daily  as well as shoe modifications and orthotics. Will check orthotic coverage for her as well.  Plantar fascial braces dispensed. -In regards to the onychomycosis, tinea pedis we discussed Lamisil.  We discussed other treatment options as well and she wants to proceed with oral therapy.  Discussed side effects.  We will check a CBC and LFT prior to starting medication.  Once this comes back normal I will order  the medication. -We discussed partial nail avulsions with chemical matricectomy.  Discussed the procedure as well as postoperative course.  We will update his next appointment.  Return in about 4 weeks (around 05/23/2019).

## 2019-05-02 ENCOUNTER — Telehealth: Payer: Self-pay | Admitting: *Deleted

## 2019-05-02 NOTE — Telephone Encounter (Signed)
Left message for pt to call for results  

## 2019-05-02 NOTE — Telephone Encounter (Signed)
-----   Message from Vivi Barrack, DPM sent at 04/30/2019  4:22 PM EDT ----- Val- please let her know that she can start Lamisil. Can you please order Lamisil 250mg  daily x 90 days and we will recheck in 4 weeks when she comes back in. Thanks.

## 2019-05-05 MED ORDER — TERBINAFINE HCL 250 MG PO TABS: 250.0000 mg | ORAL_TABLET | Freq: Every day | ORAL | 0 refills | Status: AC

## 2019-05-05 NOTE — Telephone Encounter (Signed)
I informed pt of Dr. Gabriel Rung review of results and orders. Pt states understanding and asked if she needed to keep her appt on 05/22/2019, it told pt that if she was seen for another problem keep the 05/22/2019 appt and ask Dr. Ardelle Anton if she needed to come in another 2 weeks.

## 2019-05-22 ENCOUNTER — Ambulatory Visit: Payer: BLUE CROSS/BLUE SHIELD | Admitting: Podiatry

## 2019-05-22 ENCOUNTER — Other Ambulatory Visit: Payer: BLUE CROSS/BLUE SHIELD | Admitting: Orthotics

## 2019-07-07 ENCOUNTER — Ambulatory Visit: Payer: BC Managed Care – PPO | Admitting: Podiatry

## 2019-07-07 ENCOUNTER — Encounter: Payer: Self-pay | Admitting: Podiatry

## 2019-07-07 ENCOUNTER — Ambulatory Visit (INDEPENDENT_AMBULATORY_CARE_PROVIDER_SITE_OTHER): Payer: BC Managed Care – PPO | Admitting: Orthotics

## 2019-07-07 ENCOUNTER — Other Ambulatory Visit: Payer: Self-pay

## 2019-07-07 VITALS — Temp 98.2°F

## 2019-07-07 DIAGNOSIS — M79676 Pain in unspecified toe(s): Secondary | ICD-10-CM

## 2019-07-07 DIAGNOSIS — M79671 Pain in right foot: Secondary | ICD-10-CM

## 2019-07-07 DIAGNOSIS — M79672 Pain in left foot: Secondary | ICD-10-CM

## 2019-07-07 DIAGNOSIS — M722 Plantar fascial fibromatosis: Secondary | ICD-10-CM

## 2019-07-07 DIAGNOSIS — M79673 Pain in unspecified foot: Secondary | ICD-10-CM

## 2019-07-07 DIAGNOSIS — R609 Edema, unspecified: Secondary | ICD-10-CM

## 2019-07-07 DIAGNOSIS — G8929 Other chronic pain: Secondary | ICD-10-CM

## 2019-07-07 DIAGNOSIS — Z79899 Other long term (current) drug therapy: Secondary | ICD-10-CM

## 2019-07-07 NOTE — Progress Notes (Signed)
Patient came into today to be cast for Custom Foot Orthotics. Upon recommendation of Dr. Jacqualyn Posey Patient presents with hx of arch pain Goals are long arch support, RF stability Plan vendor [place]

## 2019-07-08 ENCOUNTER — Telehealth: Payer: Self-pay | Admitting: *Deleted

## 2019-07-08 DIAGNOSIS — M722 Plantar fascial fibromatosis: Secondary | ICD-10-CM

## 2019-07-08 DIAGNOSIS — M79671 Pain in right foot: Secondary | ICD-10-CM

## 2019-07-08 DIAGNOSIS — R609 Edema, unspecified: Secondary | ICD-10-CM

## 2019-07-08 DIAGNOSIS — T148XXA Other injury of unspecified body region, initial encounter: Secondary | ICD-10-CM

## 2019-07-08 NOTE — Telephone Encounter (Signed)
-----   Message from Trula Slade, DPM sent at 07/07/2019  1:36 PM EDT ----- Can you please order an MRI of the right foot and ankle? This is to rule out a plantar fascial tear but also due to swelling of the foot.

## 2019-07-08 NOTE — Telephone Encounter (Signed)
Faxed orders to [facility], orders to Gretta Arab, RN for pre-cert.

## 2019-07-09 ENCOUNTER — Telehealth: Payer: Self-pay | Admitting: *Deleted

## 2019-07-09 NOTE — Progress Notes (Signed)
Subjective: 43 year old female presents the office today for follow-up evaluation and continued pain mostly to the right arch.  This is been a chronic issue for her.  She also overall pain steroid injections have not been helpful when she has been seen by my physician.  She says it hurts when she is sitting and she stands back up.  She also states the majority pain is present on her feet all day.  She also states that she gets swelling to her entire foot after she is on her feet.  She denies any recent injury.  No numbness or tingling.  No radiating pain.  No other joint pain.  She states that she has difficulty going to work some days because of pain.  She is asking for intermittent FMLA.  She is in the nails getting better.  She is finished with Lamisil.  Denies any systemic complaints such as fevers, chills, nausea, vomiting. No acute changes since last appointment, and no other complaints at this time.   Objective: AAO x3, NAD DP/PT pulses palpable bilaterally, CRT less than 3 seconds Majority tenderness is the plantar fascia in the arch of the foot near the plantar fascial appears to be intact.  There is no area pinpoint tenderness.  She starting to get some discomfort along the flexor tendons of the ankle.  He does appear to be intact.  There is no erythema or warmth.  Negative Tinel sign Toenail is doing better.  There is no pain the nails. No open lesions or pre-ulcerative lesions.  No pain with calf compression, swelling, warmth, erythema  Assessment: Right chronic foot pain.  Plan fasciitis.  Plan: -All treatment options discussed with the patient including all alternatives, risks, complications.  -We long discussion regards to treatment options.  She is quite a bit of tightness and she is having difficulty with dorsiflexion of her foot.  Will order physical therapy.  Also going to order an MRI as well plan fascial tear.  She has more safe orthotics.  Continue anti-inflammatories, monitor  him.  Continue with home stretching, icing exercises daily.  Plantar fascial taping was applied today. -We will get him paperwork  today for intermittent FMLA for her. Will have this completed.  -Will recheck blood work due to [employer].  -Patient encouraged to call the office with any questions, concerns, change in symptoms.   Trula Slade DPM

## 2019-07-09 NOTE — Telephone Encounter (Signed)
Called and left a message for the patient to call me back at the [place] office at [phone]. Amiayah Giebel 

## 2019-07-16 ENCOUNTER — Telehealth: Payer: Self-pay | Admitting: *Deleted

## 2019-07-16 NOTE — Telephone Encounter (Signed)
Called and left a message per Dr Jacqualyn Posey that he wanted patient to recheck blood work and the order was already in the system and to call me at [phone] with any concerns or questions. Lattie Haw

## 2019-07-16 NOTE — Telephone Encounter (Signed)
-----   Message from Trula Slade, DPM sent at 07/09/2019 11:34 AM EDT ----- Since she is on Lamisil I want to recheck blood work. I put this order in if you could please let her know. Thanks.

## 2019-07-29 ENCOUNTER — Other Ambulatory Visit: Payer: Self-pay

## 2019-07-29 ENCOUNTER — Ambulatory Visit: Payer: BC Managed Care – PPO | Admitting: Orthotics

## 2019-07-29 DIAGNOSIS — M79671 Pain in right foot: Secondary | ICD-10-CM

## 2019-07-29 DIAGNOSIS — T148XXA Other injury of unspecified body region, initial encounter: Secondary | ICD-10-CM

## 2019-07-29 DIAGNOSIS — M79672 Pain in left foot: Secondary | ICD-10-CM

## 2019-07-29 DIAGNOSIS — M722 Plantar fascial fibromatosis: Secondary | ICD-10-CM

## 2019-07-29 NOTE — Progress Notes (Signed)
Patient came in today to pick up custom made foot orthotics.  The goals were accomplished and the patient reported no dissatisfaction with said orthotics.  Patient was advised of breakin period and how to report any issues. 

## 2019-08-18 ENCOUNTER — Ambulatory Visit: Payer: BC Managed Care – PPO | Admitting: Podiatry

## 2019-09-04 ENCOUNTER — Emergency Department (HOSPITAL_COMMUNITY): Payer: BC Managed Care – PPO

## 2019-09-04 ENCOUNTER — Emergency Department (HOSPITAL_COMMUNITY)
Admission: EM | Admit: 2019-09-04 | Discharge: 2019-09-04 | Disposition: A | Discharge status: Home / Self Care | Payer: BC Managed Care – PPO | Attending: Emergency Medicine | Admitting: Emergency Medicine

## 2019-09-04 ENCOUNTER — Other Ambulatory Visit: Payer: Self-pay

## 2019-09-04 ENCOUNTER — Ambulatory Visit (INDEPENDENT_AMBULATORY_CARE_PROVIDER_SITE_OTHER)
Admission: EM | Admit: 2019-09-04 | Discharge: 2019-09-04 | Disposition: A | Discharge status: Home / Self Care | Payer: BC Managed Care – PPO | Source: Home / Self Care

## 2019-09-04 ENCOUNTER — Encounter (HOSPITAL_COMMUNITY): Payer: Self-pay

## 2019-09-04 DIAGNOSIS — S0083XA Contusion of other part of head, initial encounter: Secondary | ICD-10-CM | POA: Insufficient documentation

## 2019-09-04 DIAGNOSIS — Z79899 Other long term (current) drug therapy: Secondary | ICD-10-CM | POA: Diagnosis not present

## 2019-09-04 DIAGNOSIS — S0081XA Abrasion of other part of head, initial encounter: Secondary | ICD-10-CM | POA: Diagnosis not present

## 2019-09-04 DIAGNOSIS — R519 Headache, unspecified: Secondary | ICD-10-CM | POA: Insufficient documentation

## 2019-09-04 DIAGNOSIS — Y939 Activity, unspecified: Secondary | ICD-10-CM | POA: Insufficient documentation

## 2019-09-04 DIAGNOSIS — Y999 Unspecified external cause status: Secondary | ICD-10-CM | POA: Diagnosis not present

## 2019-09-04 DIAGNOSIS — H1132 Conjunctival hemorrhage, left eye: Secondary | ICD-10-CM | POA: Diagnosis not present

## 2019-09-04 DIAGNOSIS — Y929 Unspecified place or not applicable: Secondary | ICD-10-CM | POA: Diagnosis not present

## 2019-09-04 DIAGNOSIS — S0993XA Unspecified injury of face, initial encounter: Secondary | ICD-10-CM
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
## 2019-09-04 MED ORDER — METHOCARBAMOL 500 MG PO TABS: 500.0000 mg | ORAL_TABLET | Freq: Every evening | ORAL | 0 refills | Status: AC | PRN

## 2019-09-04 MED ORDER — KETOROLAC TROMETHAMINE 15 MG/ML IJ SOLN: 15.0000 mg | Freq: Once | INTRAMUSCULAR | Status: DC

## 2019-09-04 MED ORDER — KETOROLAC TROMETHAMINE 60 MG/2ML IM SOLN
30.0000 mg | Freq: Once | INTRAMUSCULAR | Status: AC
  Administered 2019-09-04: 30 mg via INTRAMUSCULAR
  Filled 2019-09-04: qty 2

## 2019-09-04 NOTE — ED Notes (Signed)
Pt denies any vision issues. Reports vision is normal.

## 2019-09-04 NOTE — ED Notes (Signed)
Patient verbalizes understanding of discharge instructions. Opportunity for questioning and answers were provided. Armband removed by staff, pt discharged from ED.  
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
## 2019-09-04 NOTE — Discharge Instructions (Signed)
Take Ibuprofen or Naproxen for pain as directed Use ice on the face for help with swelling and pain. Use for up to 15 minutes, several times daily Use Robaxin for muscle pain and to help you sleep at night. Do not take before driving Return if worsening

## 2019-09-04 NOTE — Discharge Instructions (Addendum)
Go to the ER for CT scan
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
## 2020-07-01 ENCOUNTER — Other Ambulatory Visit: Payer: Self-pay

## 2020-07-01 ENCOUNTER — Ambulatory Visit (INDEPENDENT_AMBULATORY_CARE_PROVIDER_SITE_OTHER): Payer: BC Managed Care – PPO | Admitting: Podiatry

## 2020-07-01 DIAGNOSIS — M722 Plantar fascial fibromatosis: Secondary | ICD-10-CM | POA: Diagnosis not present

## 2020-07-01 MED ORDER — METHYLPREDNISOLONE 4 MG PO TBPK: ORAL_TABLET | ORAL | 0 refills | Status: AC

## 2020-07-01 MED ORDER — KETOCONAZOLE 2 % EX CREA: 1.0000 "application " | TOPICAL_CREAM | Freq: Every day | CUTANEOUS | 2 refills | Status: AC

## 2020-07-05 NOTE — Progress Notes (Signed)
Subjective: 44 year old female presents the office today for concerns of recurrence of the pain to both of her feet, plantar fasciitis.  She states that she states that her 2nd day before start out 2 months ago.  She states that she wants to get back to physical therapy and she also wants to proceed with the MRI that was previously ordered.  She states that she previously was out on FMLA and she is only given 8 hours a week at work to be off but she states that she is about 20 to 25 hours between physical therapy and pain.  She denies any recent injury.  No radiating pain or weakness. Denies any systemic complaints such as fevers, chills, nausea, vomiting. No acute changes since last appointment, and no other complaints at this time.   Objective: AAO x3, NAD DP/PT pulses palpable bilaterally, CRT less than 3 seconds Tenderness palpation on plantar medial tubercle of the calcaneus at the insertion of plantar fascia bilaterally.  Mild discomfort the arch of the foot.  There is no pain with compression of calcaneus.  No pain with Achilles tendon.  Negative Tinel sign.  No other areas of discomfort.  MMT 5/5. No pain with calf compression, swelling, warmth, erythema  Assessment: Bilateral recurrence of plantar fasciitis  Plan: -All treatment options discussed with the patient including all alternatives, risks, complications.  -Medrol Dosepak prescribed.  Discussed steroid injection but she was told off on this. -Order physical therapy today for the benchmark -We will try to reorder the MRI -We will need to update FMLA. -Patient encouraged to call the office with any questions, concerns, change in symptoms.   Vivi Barrack DPM

## 2020-07-13 DIAGNOSIS — M79676 Pain in unspecified toe(s): Secondary | ICD-10-CM

## 2020-07-19 ENCOUNTER — Telehealth: Payer: Self-pay | Admitting: *Deleted

## 2020-07-19 NOTE — Telephone Encounter (Signed)
Called and spoke with Olivia Abbott from [facility] and the representative stated that the procedure codes 32202 and [id number] do not require a prior authorization. Misty Stanley

## 2020-07-20 ENCOUNTER — Other Ambulatory Visit: Payer: Self-pay | Admitting: Podiatry

## 2020-07-20 ENCOUNTER — Telehealth: Payer: Self-pay | Admitting: *Deleted

## 2020-07-20 DIAGNOSIS — M722 Plantar fascial fibromatosis: Secondary | ICD-10-CM

## 2020-07-20 DIAGNOSIS — M79671 Pain in right foot: Secondary | ICD-10-CM

## 2020-07-20 DIAGNOSIS — T148XXA Other injury of unspecified body region, initial encounter: Secondary | ICD-10-CM

## 2020-07-20 DIAGNOSIS — R609 Edema, unspecified: Secondary | ICD-10-CM

## 2020-07-20 NOTE — Telephone Encounter (Signed)
Called and spoke with [facility] from [facility] and [place] will call the patient to schedule. Olivia Abbott

## 2020-08-16 ENCOUNTER — Ambulatory Visit: Payer: BC Managed Care – PPO | Admitting: Podiatry

## 2020-12-07 ENCOUNTER — Other Ambulatory Visit: Payer: BC Managed Care – PPO

## 2020-12-07 ENCOUNTER — Ambulatory Visit: Payer: BC Managed Care – PPO | Admitting: Podiatry

## 2020-12-20 ENCOUNTER — Ambulatory Visit: Payer: BC Managed Care – PPO | Admitting: Podiatry

## 2020-12-26 ENCOUNTER — Other Ambulatory Visit: Payer: Self-pay

## 2020-12-26 ENCOUNTER — Ambulatory Visit
Admission: RE | Admit: 2020-12-26 | Discharge: 2020-12-26 | Disposition: A | Discharge status: Home / Self Care | Payer: BC Managed Care – PPO | Source: Ambulatory Visit | Attending: Podiatry | Admitting: Podiatry

## 2020-12-26 DIAGNOSIS — M79671 Pain in right foot: Secondary | ICD-10-CM

## 2020-12-26 DIAGNOSIS — M722 Plantar fascial fibromatosis: Secondary | ICD-10-CM

## 2020-12-26 DIAGNOSIS — R609 Edema, unspecified: Secondary | ICD-10-CM

## 2020-12-26 DIAGNOSIS — T148XXA Other injury of unspecified body region, initial encounter: Secondary | ICD-10-CM

## 2020-12-26 DIAGNOSIS — M79672 Pain in left foot: Secondary | ICD-10-CM

## 2020-12-26 IMAGING — MR MR ANKLE*R* W/O CM
5 series · 37 of 40 positions shown · non-contrast
Comparison: Foot x-ray [DATE]

CLINICAL DATA: Lateral right ankle pain for 3 years

EXAM:
MRI OF THE RIGHT ANKLE WITHOUT CONTRAST
TECHNIQUE: Multiplanar, multisequence MR imaging of the ankle was performed. No
intravenous contrast was administered.

[Series 4: T2 fat-sat · axial · 3.0mm · 0.50mm/px · z∈[-150,-29]mm · 9 of 32 slices shown (1 of 2)]
[im 1/32]
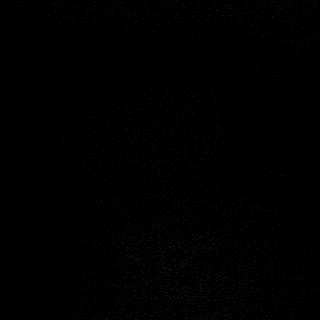
[im 4/32]
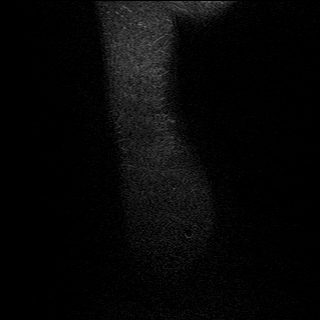
[im 7/32]
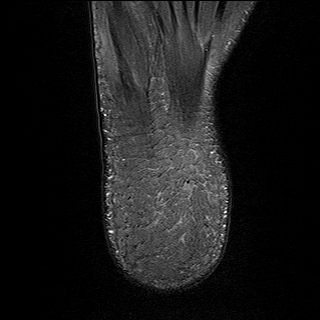
[im 11/32]
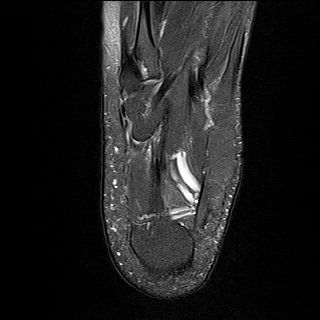
[im 14/32]
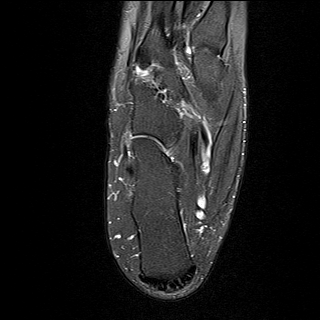
[im 18/32]
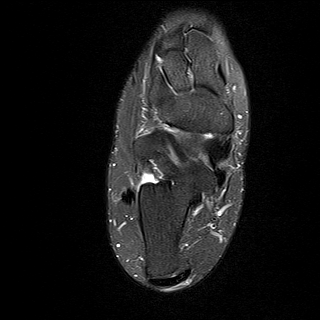
[im 21/32]
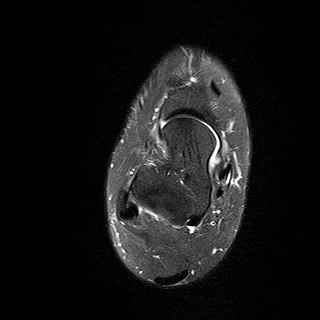
[im 28/32]
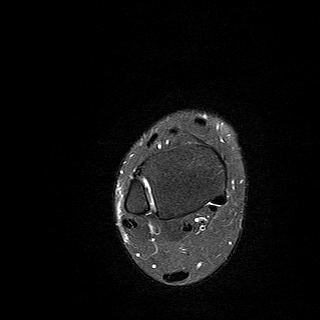
[im 32/32]
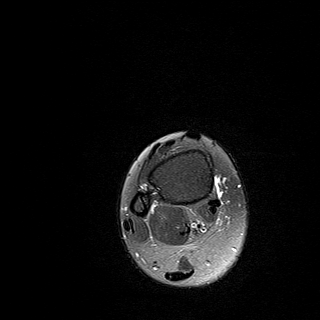

[Series 5: PD fat-sat · axial · 3.0mm · 0.42mm/px · z∈[-150,-29]mm · 10 of 32 slices shown]
[im 1/32]
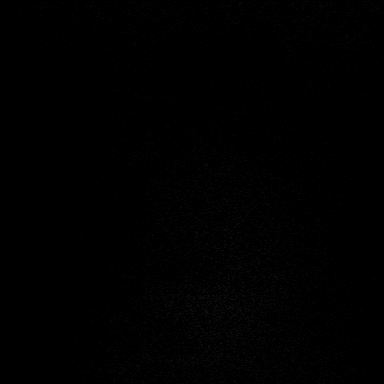
[im 4/32]
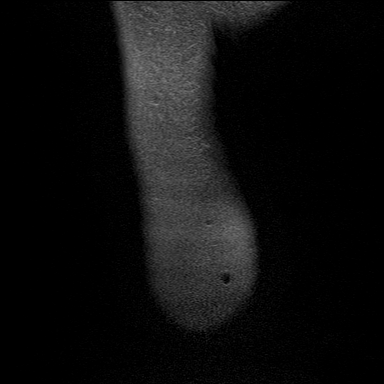
[im 7/32]
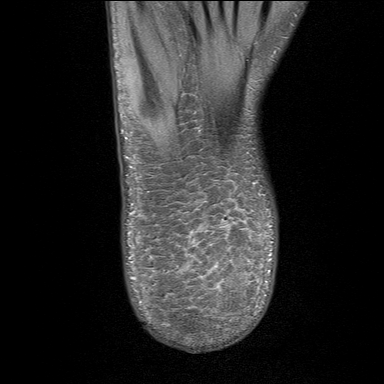
[im 11/32]
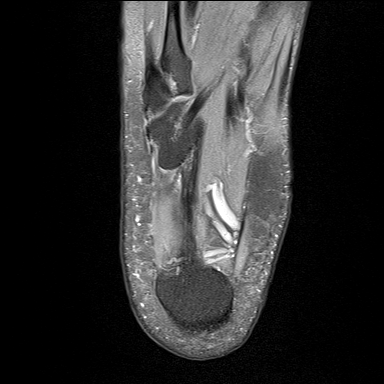
[im 14/32]
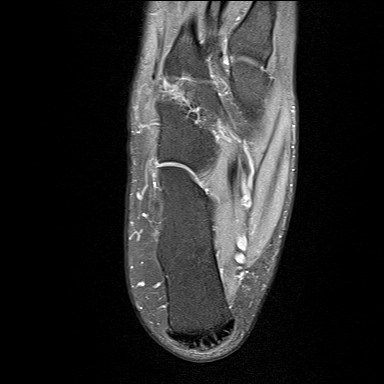
[im 18/32]
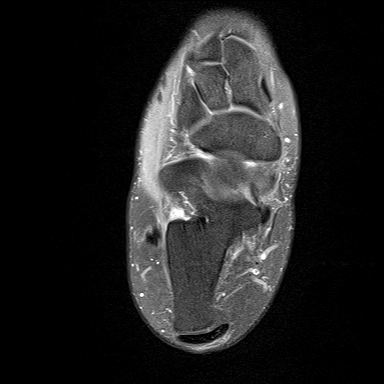
[im 21/32]
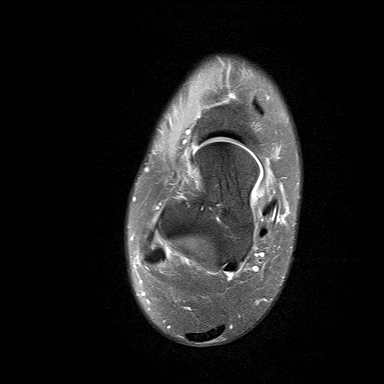
[im 25/32]
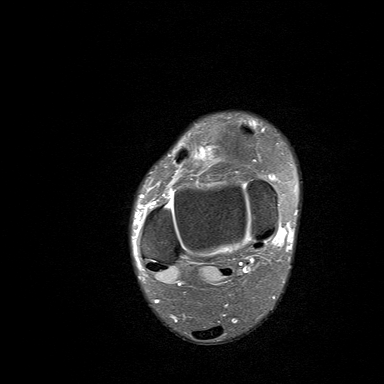
[im 28/32]
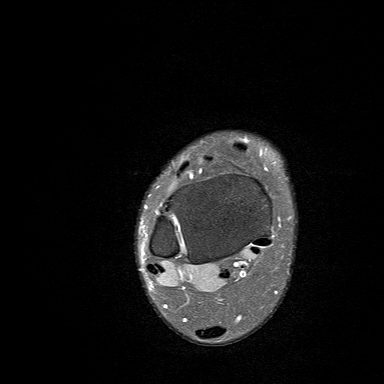
[im 32/32]
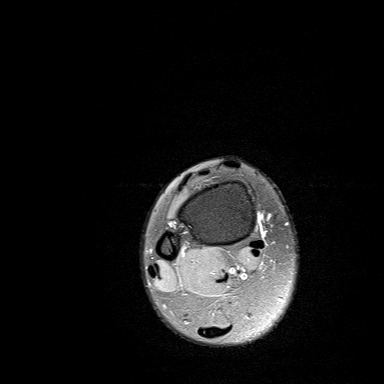

[Series 6: T1 · sagittal · 4.0mm · 0.56mm/px · 5 of 18 slices shown]
[im 1/18]
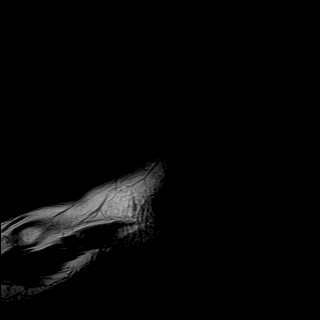
[im 5/18]
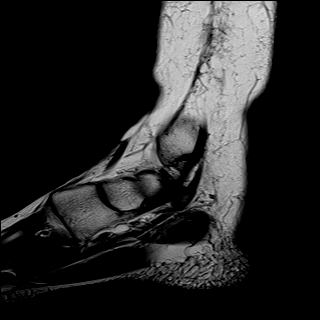
[im 9/18]
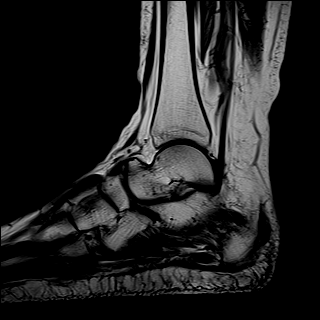
[im 13/18]
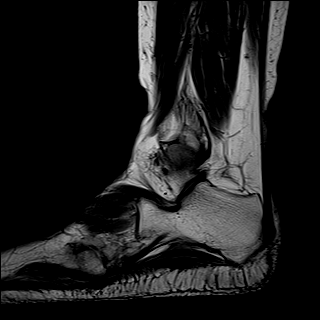
[im 18/18]
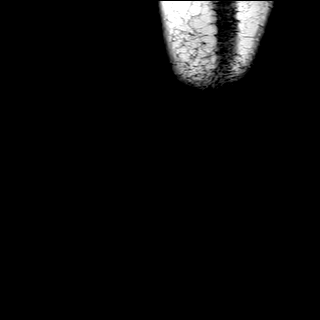

[Series 7: STIR · sagittal · 4.0mm · 0.35mm/px · 5 of 18 slices shown]
[im 1/18]
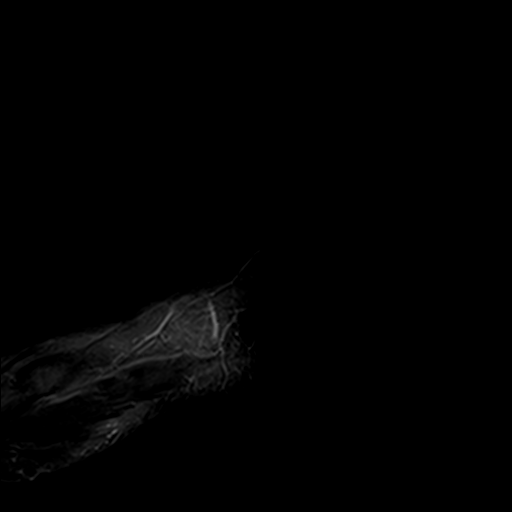
[im 5/18]
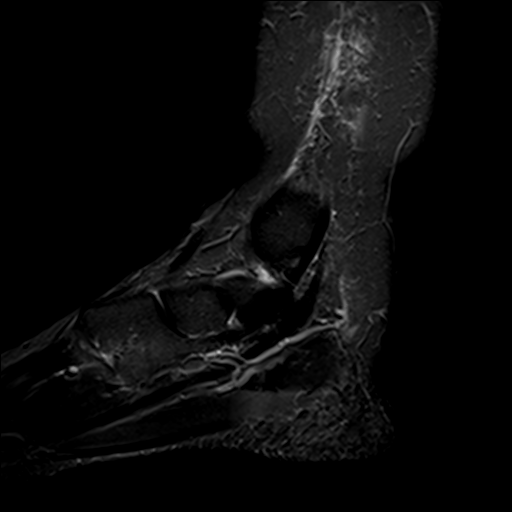
[im 9/18]
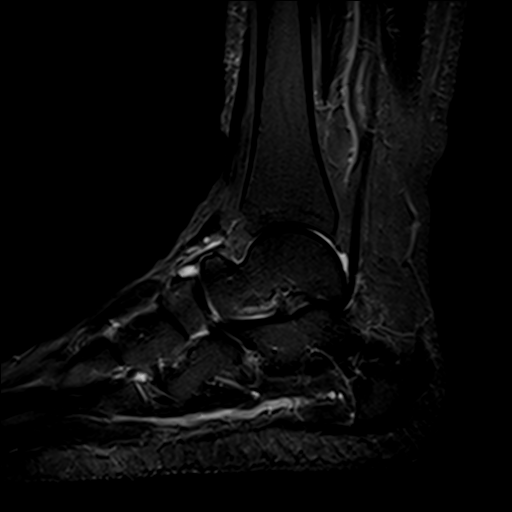
[im 13/18]
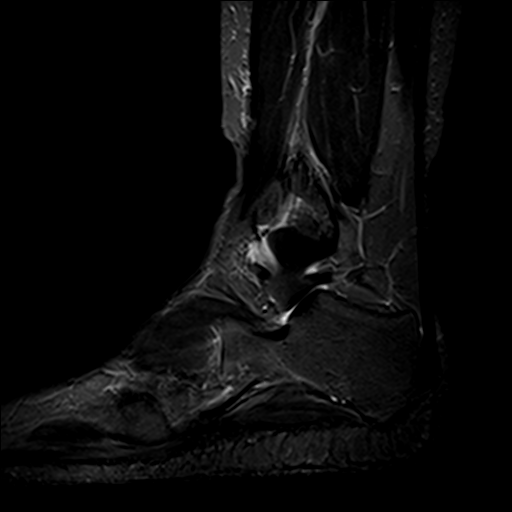
[im 18/18]
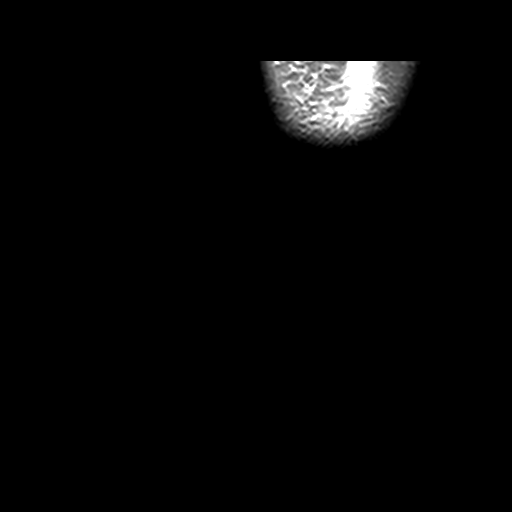

[Series 8: T2 fat-sat · coronal · 3.0mm · 0.50mm/px · 8 of 35 slices shown (2 of 2)]
[im 1/35]
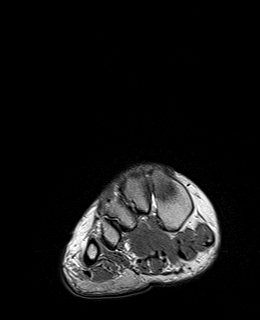
[im 4/35]
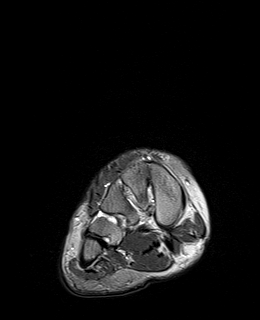
[im 12/35]
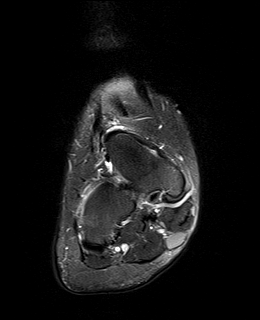
[im 16/35]
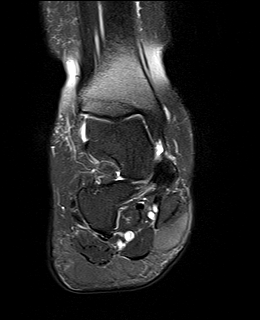
[im 19/35]
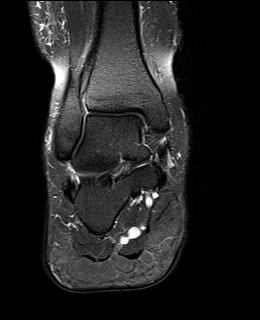
[im 23/35]
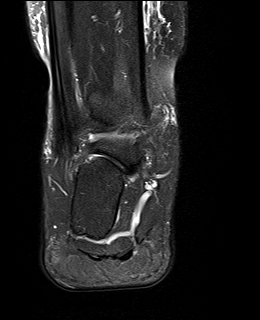
[im 31/35]
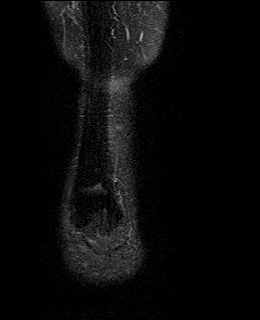
[im 35/35]
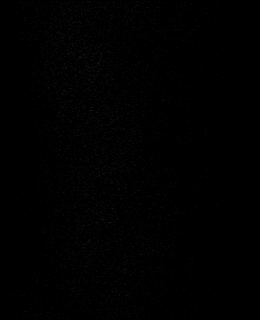

[37 of 40 positions shown; findings below may reference images not displayed]

FINDINGS: TENDONS

Peroneal: Intact peroneus longus and peroneus brevis tendons.

Posteromedial: Intact tibialis posterior, flexor hallucis longus and
flexor digitorum longus tendons.

Anterior: Intact tibialis anterior, extensor hallucis longus and
extensor digitorum longus tendons.

Achilles: Intact.  Small enthesophyte at the tendon insertion.

Plantar Fascia: Intact without perifascial edema or tear.

LIGAMENTS

Lateral: The anterior and posterior tibiofibular ligaments are
intact. The anterior and posterior talofibular ligaments are intact.
Intact calcaneofibular ligament.

Medial: Deltoid ligament and spring ligament complex intact.

CARTILAGE

Ankle Joint: No joint effusion or chondral defect.

Subtalar Joints/Sinus Tarsi: No joint effusion or chondral defect.
Preservation of the anatomic fat within the sinus tarsi.

Bones: No marrow signal abnormality. No fracture or dislocation.

Other: No fluid collection or hematoma
IMPRESSION: Essentially unremarkable MRI of the right ankle. No evidence of
internal derangement.

## 2020-12-26 IMAGING — MR MR FOOT*R* W/O CM
4 of 5 series · 18 of 40 positions shown · non-contrast
Comparison: X-ray [DATE]

CLINICAL DATA: Right foot pain for 3 years

EXAM:
MRI OF THE RIGHT FOREFOOT WITHOUT CONTRAST
TECHNIQUE: Multiplanar, multisequence MR imaging of the right forefoot was
performed. No intravenous contrast was administered.

[Series 4: T1 · coronal · 3.0mm · 0.19mm/px · 3 of 44 slices shown (1 of 2)]
[im 8/44]
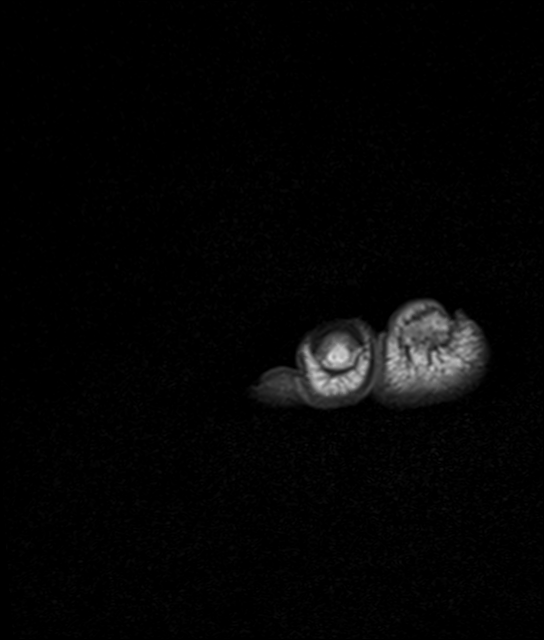
[im 24/44]
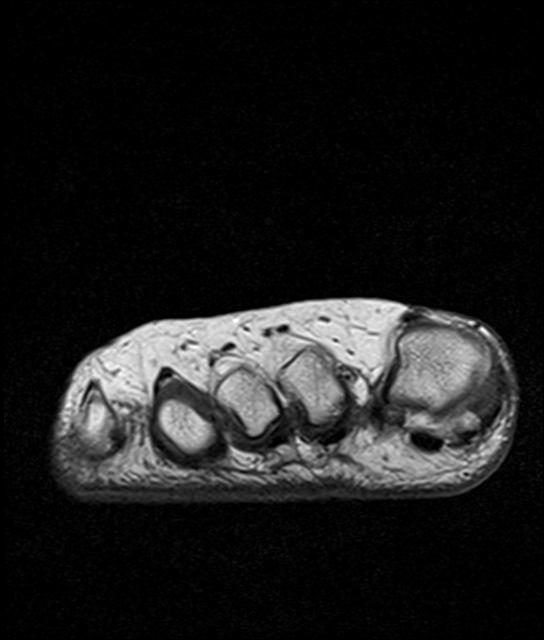
[im 40/44]
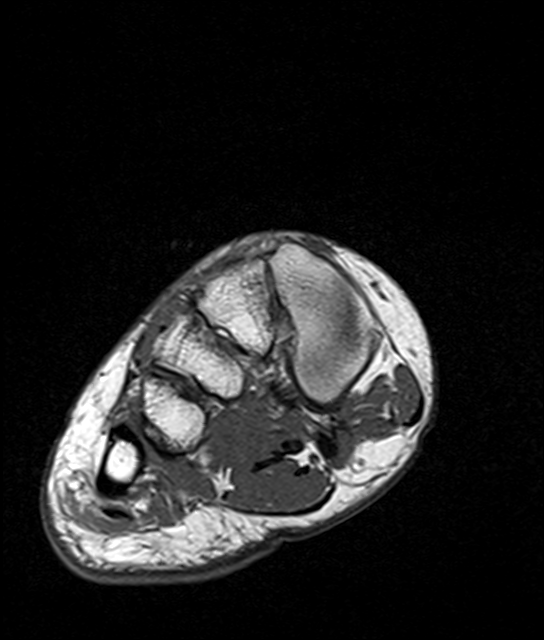

[Series 5: T2 fat-sat · coronal · 3.0mm · 0.19mm/px · 9 of 44 slices shown (1 of 2)]
[im 1/44]
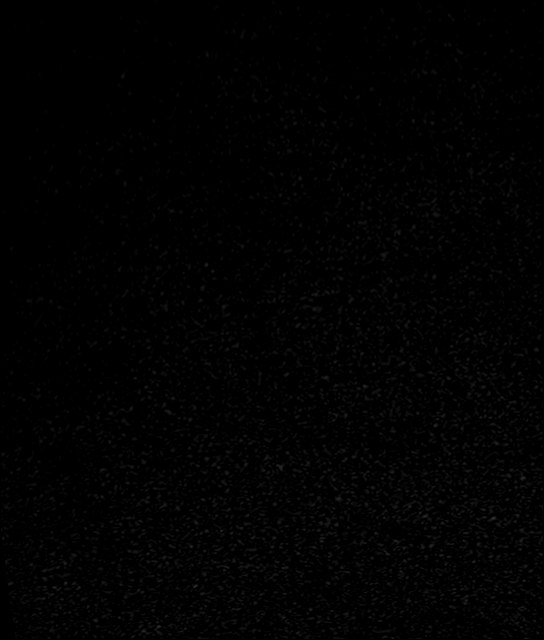
[im 5/44]
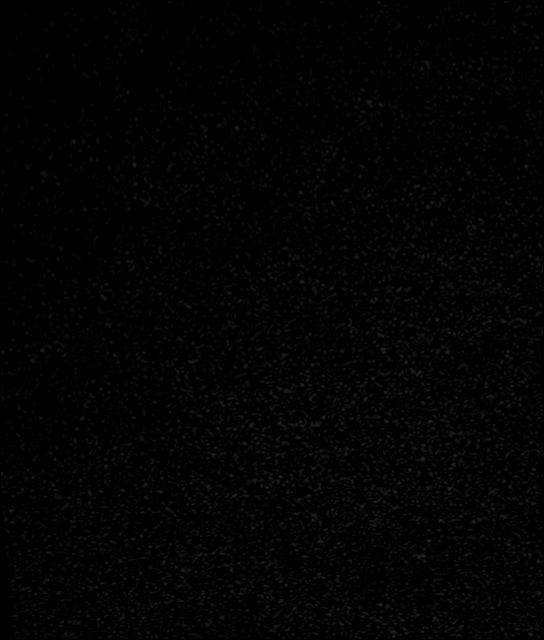
[im 9/44]
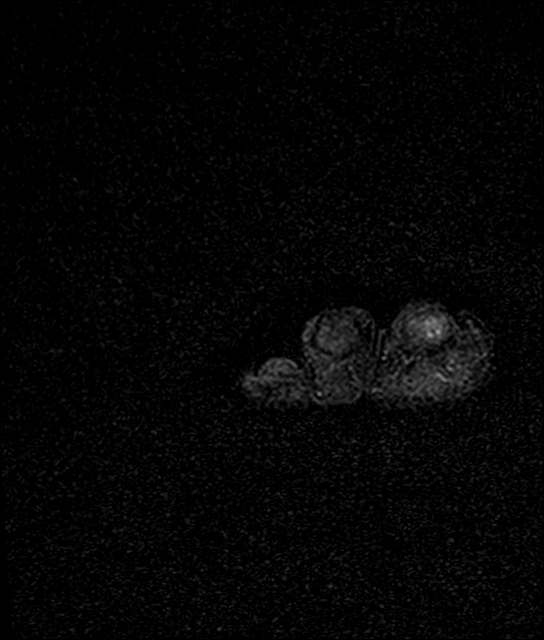
[im 13/44]
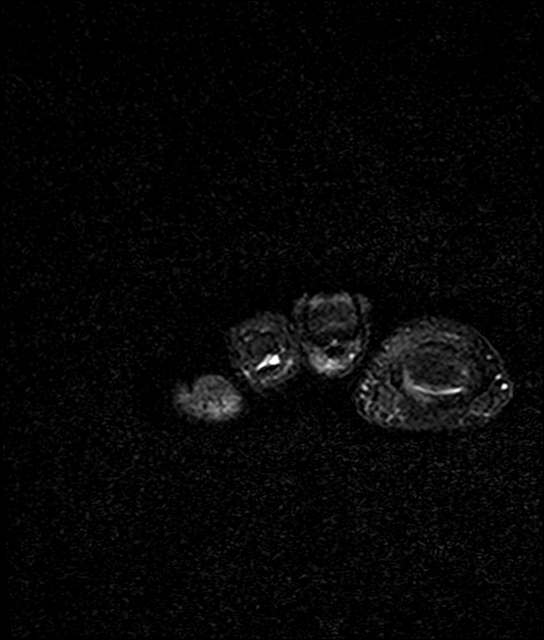
[im 18/44]
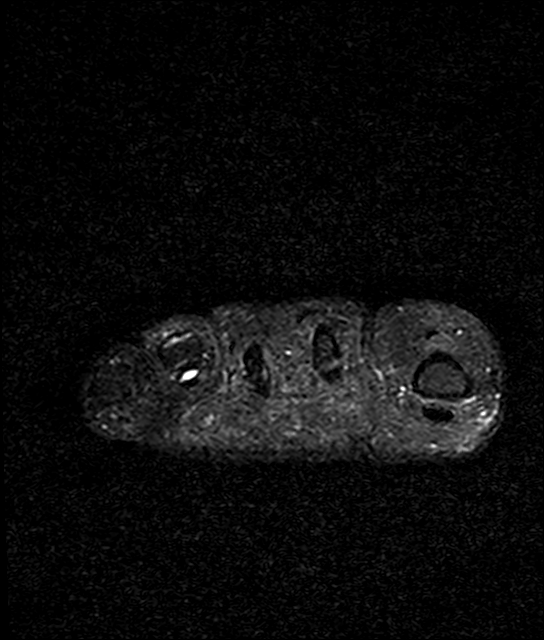
[im 22/44]
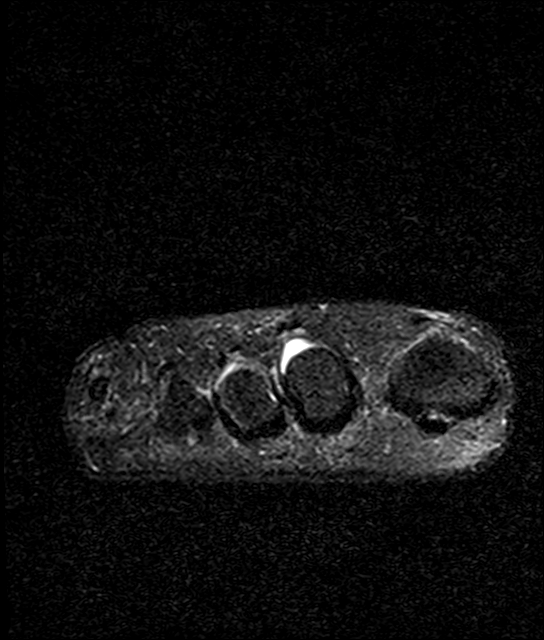
[im 26/44]
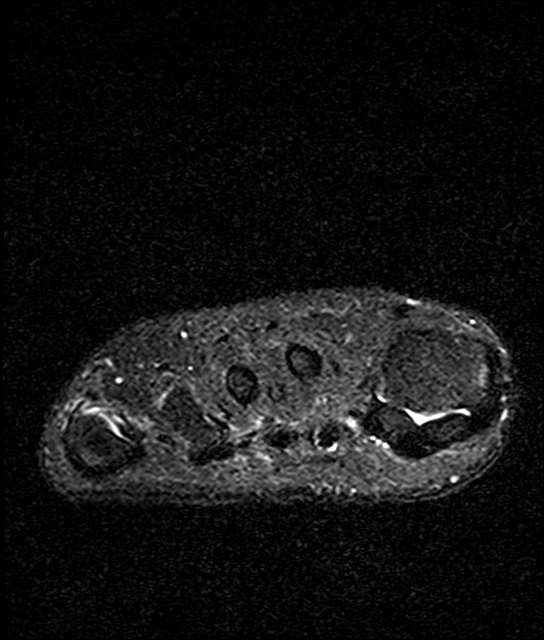
[im 31/44]
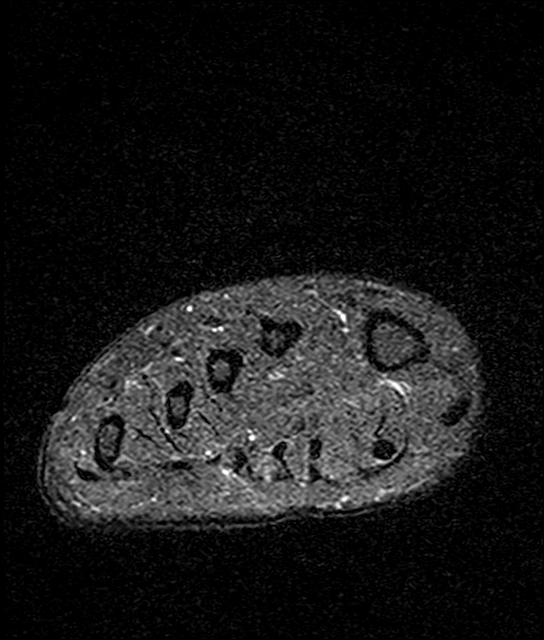
[im 39/44]
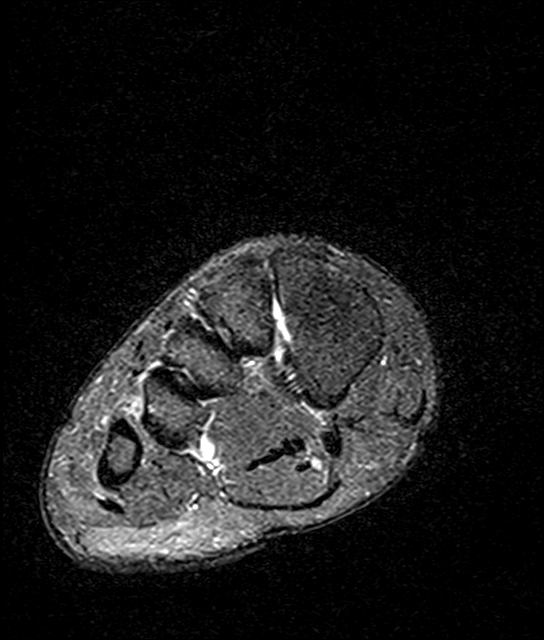

[Series 6: T2 fat-sat · axial · 3.0mm · 0.35mm/px · z∈[-179,-98]mm · 3 of 22 slices shown (2 of 2)]
[im 1/22]
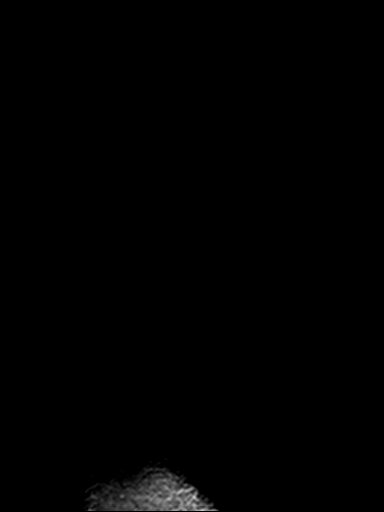
[im 11/22]
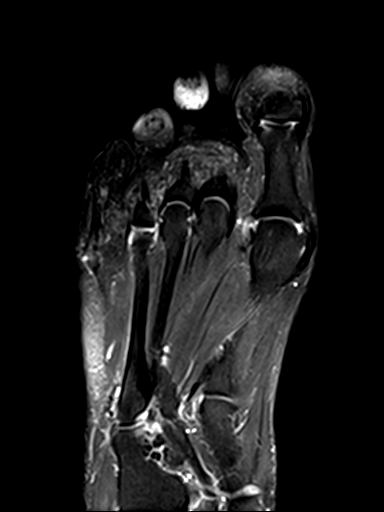
[im 22/22]
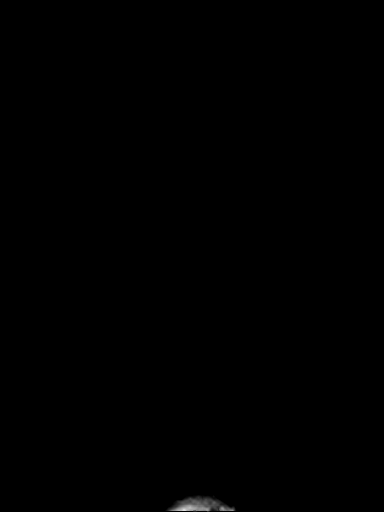

[Series 7: T1 · axial · 3.0mm · 0.35mm/px · z∈[-179,-98]mm · 3 of 22 slices shown (2 of 2)]
[im 1/22]
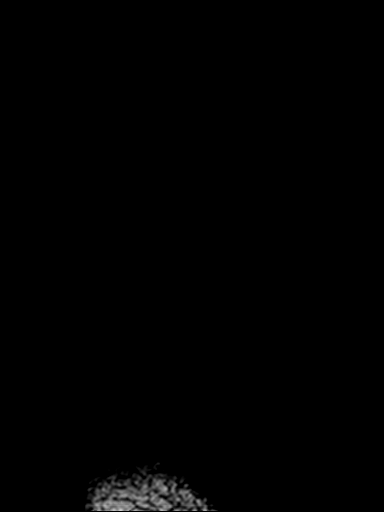
[im 11/22]
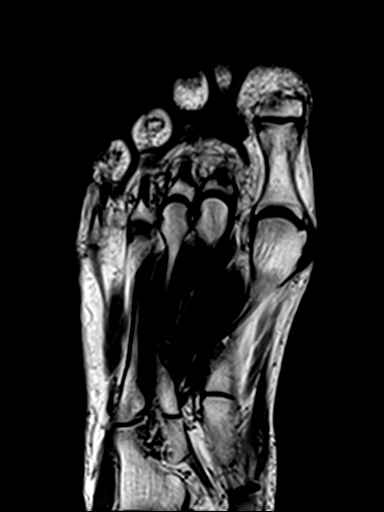
[im 22/22]
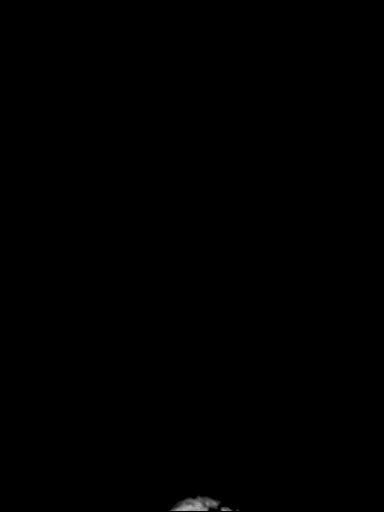

[18 of 40 positions shown; findings below may reference images not displayed]

FINDINGS: Bones/Joint/Cartilage

No acute fracture. No dislocation. Mild joint space narrowing and
marginal osteophyte formation at the first MTP joint. No joint
effusion. No bone marrow edema.

Ligaments

Intact Lisfranc ligament. Collateral ligaments of the forefoot are
intact.

Muscles and Tendons

Normal muscle bulk and signal intensity without edema, atrophy, or
fatty infiltration. Intact flexor and extensor tendons without
tendinosis, tear, or tenosynovitis.

Soft tissues

No intermetatarsal space mass or fluid collection. No soft tissue
edema or fluid collection.
IMPRESSION: 1. Mild osteoarthritis of the first MTP joint.
2. Otherwise, unremarkable MRI of the right forefoot.

## 2020-12-29 ENCOUNTER — Telehealth: Payer: Self-pay | Admitting: *Deleted

## 2020-12-29 NOTE — Telephone Encounter (Signed)
-----   Message from Vivi Barrack, DPM sent at 12/29/2020  4:34 PM EST ----- Misty Stanley- can you please call her to let her know that the MRI was essentially normal. Please have her schedule a follow up so we can further discuss treatment options.

## 2020-12-29 NOTE — Telephone Encounter (Signed)
Called patient per Dr Ardelle Anton and relayed the message and patient was wanting to have the MRI done of the left foot and left ankle and I stated we would have to get the patient in to see Dr Ardelle Anton and let him make the decision since patient has not been seen since July of last year and patient was ok with that and Lurena Joiner is going to call and make the appointment for the patient. Misty Stanley

## 2021-01-06 ENCOUNTER — Telehealth: Payer: Self-pay | Admitting: Podiatry

## 2021-01-06 NOTE — Telephone Encounter (Signed)
Patient would like continued intermittent FMLA paperwork completed with 3 days out of work  a week when swollen/painful. She would also like to be able to sit as needed at work for pain. Please advise on time needed per episode?

## 2021-01-07 NOTE — Telephone Encounter (Signed)
She can get one more month but she needs to come in as I have not seen her in over 6 months.   As far as the time needed per episode do you mean the time out of work each time it flares? What did we do before.

## 2021-01-07 NOTE — Telephone Encounter (Signed)
Before it was 3 days a week, per episode

## 2021-01-07 NOTE — Telephone Encounter (Signed)
I think 2 days a week would be OK

## 2021-01-17 ENCOUNTER — Other Ambulatory Visit: Payer: Self-pay

## 2021-01-17 ENCOUNTER — Ambulatory Visit (INDEPENDENT_AMBULATORY_CARE_PROVIDER_SITE_OTHER): Payer: BC Managed Care – PPO | Admitting: Podiatry

## 2021-01-17 DIAGNOSIS — M79671 Pain in right foot: Secondary | ICD-10-CM | POA: Diagnosis not present

## 2021-01-17 DIAGNOSIS — M79672 Pain in left foot: Secondary | ICD-10-CM

## 2021-01-17 DIAGNOSIS — M722 Plantar fascial fibromatosis: Secondary | ICD-10-CM | POA: Diagnosis not present

## 2021-01-21 NOTE — Progress Notes (Signed)
Subjective: 45 year old female presents the office today for follow-up evaluation of bilateral foot pain. She gets pain in the arches of both of her feet. She had the MRI of the right foot but she is asking for MRI of the left foot as well. She states that she gets pain intermittently to the arch of the foot. She has not been back to physical therapy in the last saw her. No recent injury or falls otherwise. Denies any systemic complaints such as fevers, chills, nausea, vomiting. No acute changes since last appointment, and no other complaints at this time.   Objective: AAO x3, NAD DP/PT pulses palpable bilaterally, CRT less than 3 seconds Majority discomfort is on the medial plantar plantar fascial in the arch of the foot bilaterally. No pain on the insertion of the plantar fascial today. There is no edema, erythema. No area of pinpoint tenderness. Negative Tinel sign. MMT 5/5. No pain with calf compression, swelling, warmth, erythema  Assessment: 45 year old female plantar fasciitis; chronic foot pain  Plan: -All treatment options discussed with the patient including all alternatives, risks, complications.  -I will order MRI of the left foot (and out of the left ankle) given the chronic nature of her pain. I reviewed the MRI and her right side which was essentially normal. 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 I discussed with her returning physical therapy were on hold off until the MRI comes back on the left. Discussed shoe modification, good arch supports. -Patient encouraged to call the office with any questions, concerns, change in symptoms.   Vivi Barrack DPM

## 2021-02-01 ENCOUNTER — Ambulatory Visit
Admission: RE | Admit: 2021-02-01 | Discharge: 2021-02-01 | Disposition: A | Discharge status: Home / Self Care | Payer: BC Managed Care – PPO | Source: Ambulatory Visit | Attending: Podiatry | Admitting: Podiatry

## 2021-02-01 DIAGNOSIS — M722 Plantar fascial fibromatosis: Secondary | ICD-10-CM

## 2021-02-10 ENCOUNTER — Ambulatory Visit
Admission: RE | Admit: 2021-02-10 | Discharge: 2021-02-10 | Disposition: A | Discharge status: Home / Self Care | Payer: BC Managed Care – PPO | Source: Ambulatory Visit | Attending: Podiatry | Admitting: Podiatry

## 2021-02-10 DIAGNOSIS — M722 Plantar fascial fibromatosis: Secondary | ICD-10-CM

## 2021-02-10 IMAGING — MR MR ANKLE*L* W/O CM
4 of 5 series · 16 of 40 positions shown · non-contrast
Comparison: MRI left ankle dated [DATE]. Left foot x-rays
dated [DATE].

CLINICAL DATA: Chronic lateral and posterior ankle pain. No injury
or prior surgery.

EXAM:
MRI OF THE LEFT ANKLE WITHOUT CONTRAST
TECHNIQUE: Multiplanar, multisequence MR imaging of the ankle was performed. No
intravenous contrast was administered.

[Series 3: PD fat-sat · axial · left · 3.0mm · 0.33mm/px · z∈[-118,-7]mm · 7 of 33 slices shown]
[im 1/33]
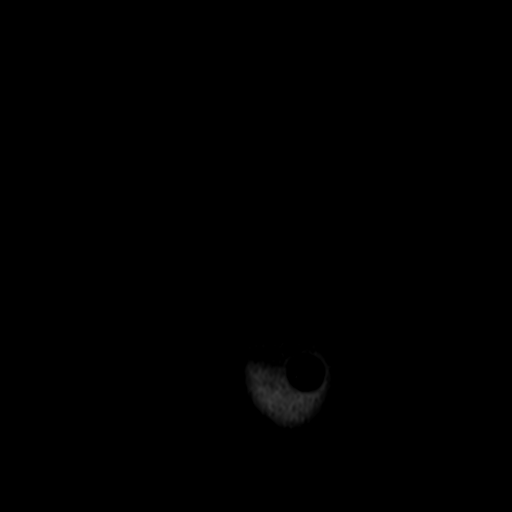
[im 5/33]
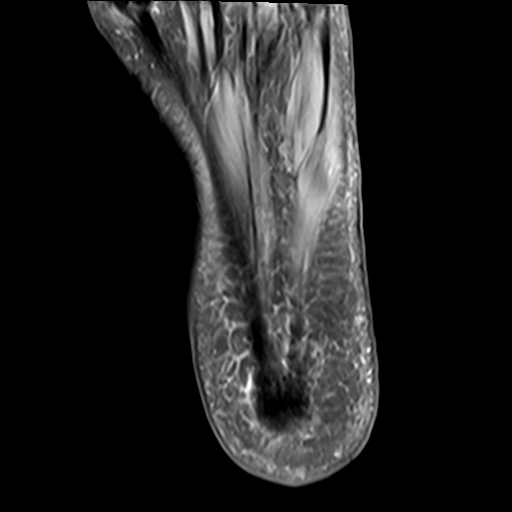
[im 9/33]
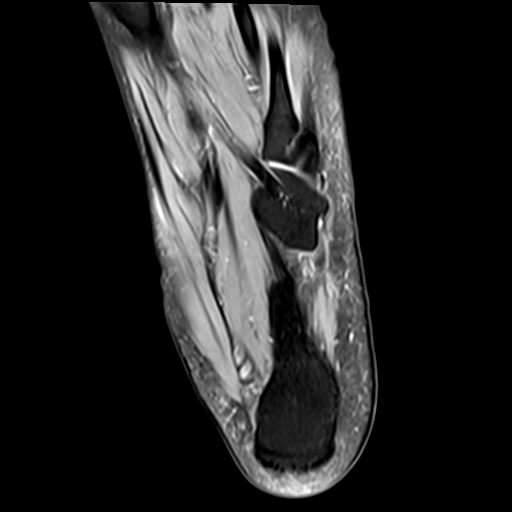
[im 13/33]
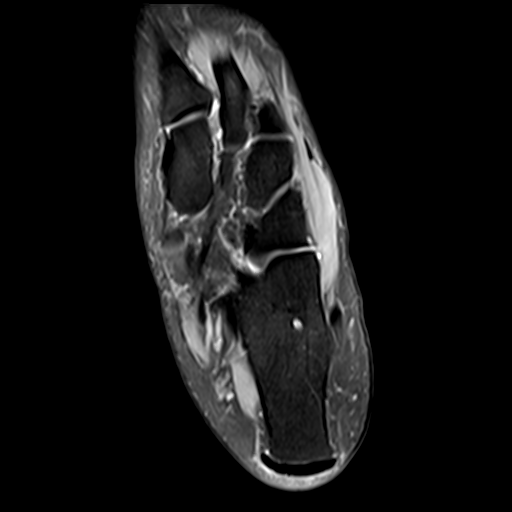
[im 17/33]
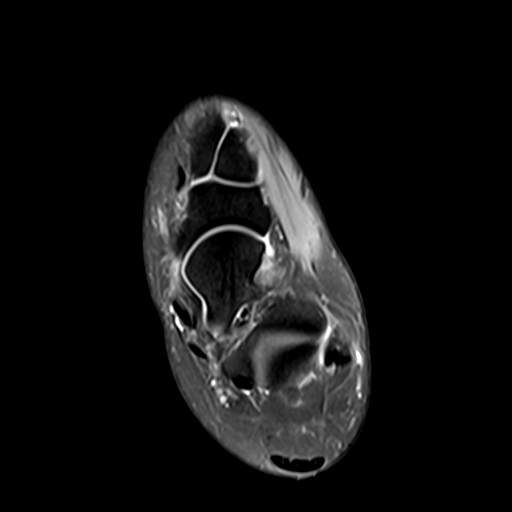
[im 21/33]
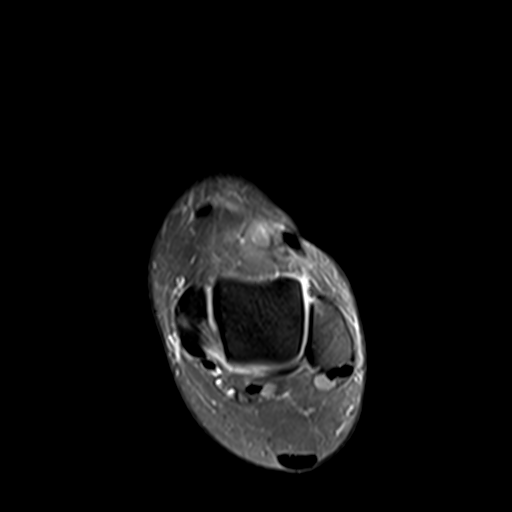
[im 29/33]
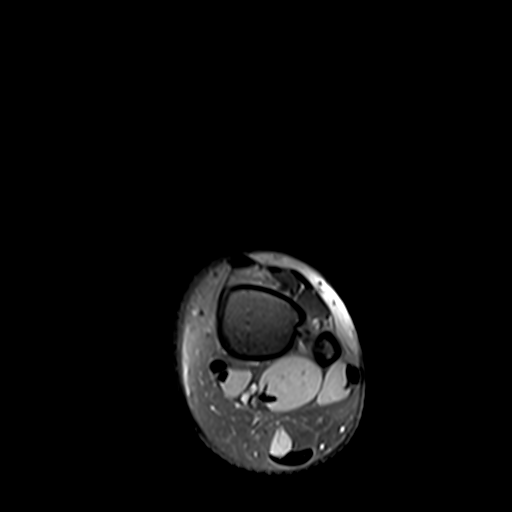

[Series 4: T2 fat-sat · axial · left · 3.0mm · 0.33mm/px · z∈[-102,-7]mm · 3 of 33 slices shown (1 of 2)]
[im 5/33]
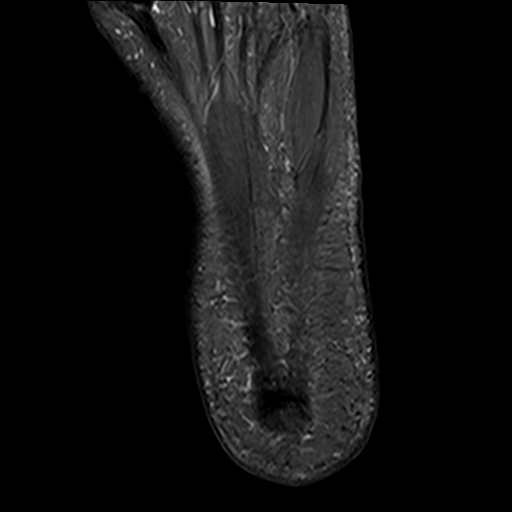
[im 17/33]
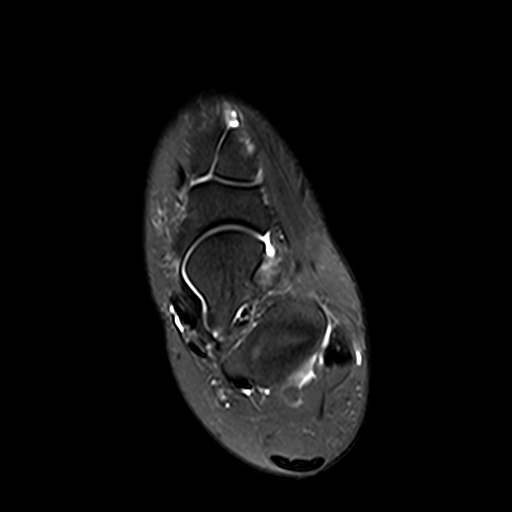
[im 29/33]
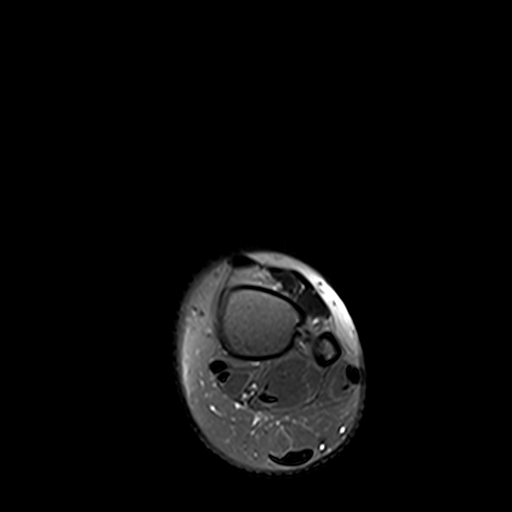

[Series 5: T1 · sagittal · left · 4.0mm · 0.27mm/px · 3 of 21 slices shown]
[im 5/21]
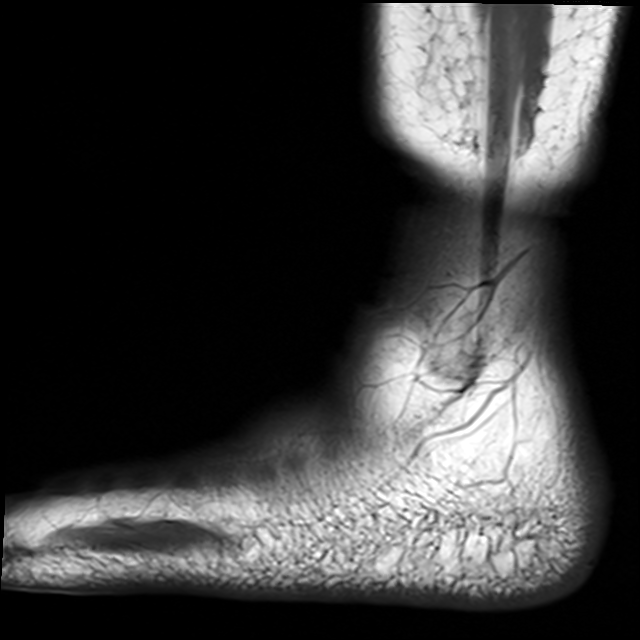
[im 13/21]
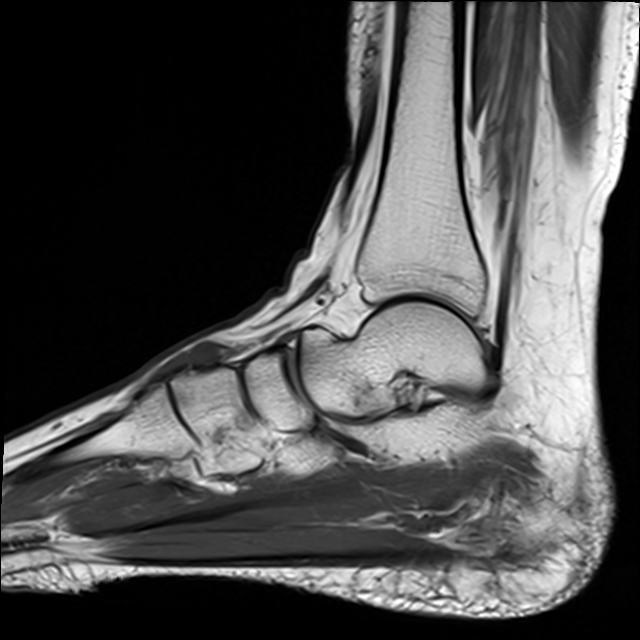
[im 21/21]
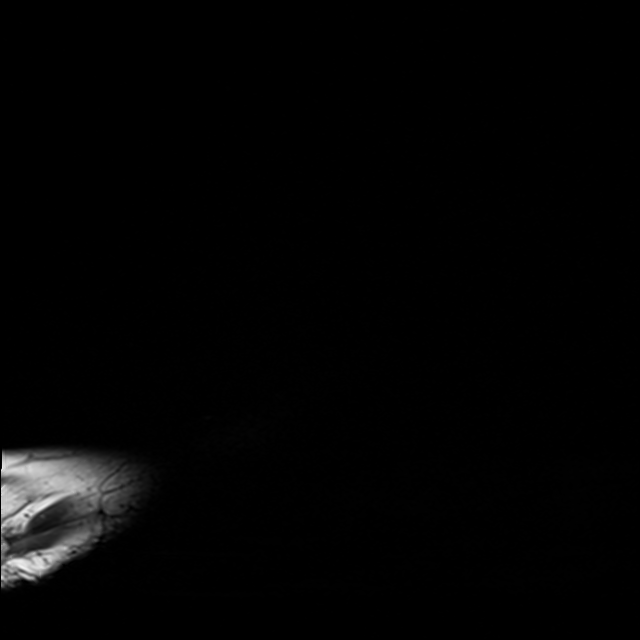

[Series 7: T2 fat-sat · coronal · left · 3.0mm · 0.25mm/px · 3 of 38 slices shown (2 of 2)]
[im 5/38]
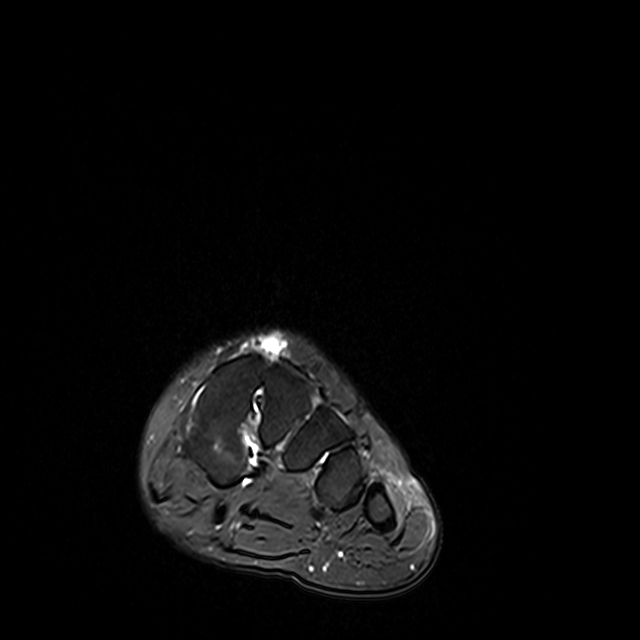
[im 21/38]
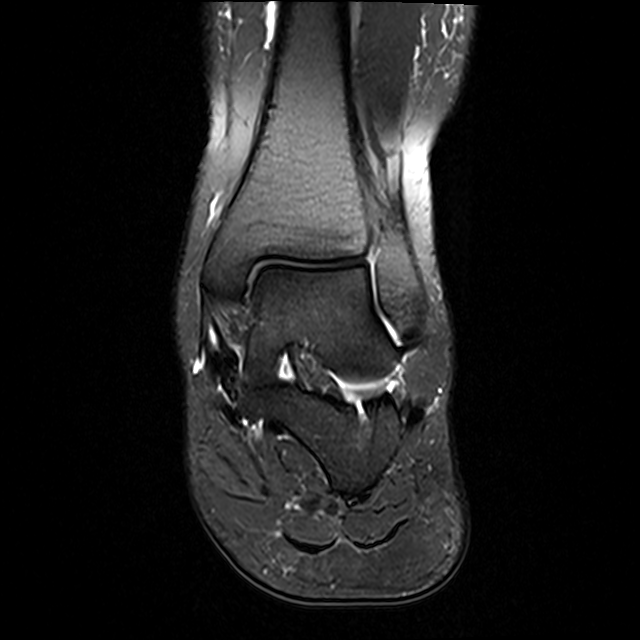
[im 33/38]
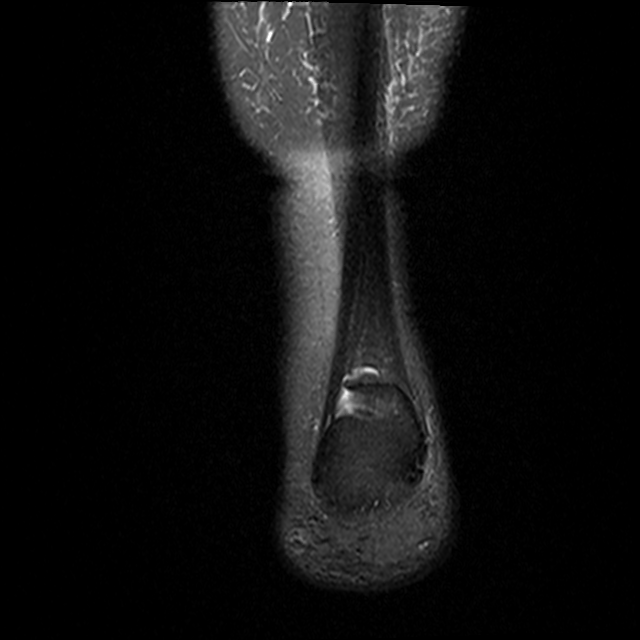

[16 of 40 positions shown; findings below may reference images not displayed]

FINDINGS: TENDONS

Peroneal: The peroneal brevis intact.

Posteromedial: Posterior tibial tendon intact with small amount of
fluid in the tendon sheath. Flexor digitorum longus tendon intact.
Flexor hallucis longus tendon intact.

Anterior: Tibialis anterior tendon intact. Extensor hallucis longus
tendon intact Extensor digitorum longus tendon intact.

Achilles: Intact. Small amount of fluid in the retrocalcaneal bursa.

Plantar Fascia: Intact.

LIGAMENTS

Lateral: Anterior talofibular ligament intact. Calcaneofibular
ligament intact. Posterior talofibular ligament intact. Anterior and
posterior tibiofibular ligaments intact.

Medial: Deltoid ligament intact. Spring ligament intact.

CARTILAGE

Ankle Joint: Small joint effusion. Normal ankle mortise. No chondral
defect.

Subtalar Joints/Sinus Tarsi: Normal subtalar joints. No subtalar
joint effusion. Normal sinus tarsi.

Bones: No marrow signal abnormality.  No fracture or dislocation.

Soft Tissue: 2.1 x 0.9 x 0.6 cm multiloculated ganglion cyst over
the dorsal midfoot, possibly rising from the second TMT joint.
IMPRESSION: 1. 2.1 cm ganglion cyst over the dorsal midfoot.
2. Mild posterior tibial tenosynovitis.

## 2021-02-16 ENCOUNTER — Telehealth: Payer: Self-pay | Admitting: *Deleted

## 2021-02-16 NOTE — Telephone Encounter (Signed)
Called and left a message for the patient and relayed the message per Dr Ardelle Anton and I stated to call the office if any concerns or questions. Misty Stanley

## 2021-02-16 NOTE — Telephone Encounter (Signed)
-----   Message from Vivi Barrack, DPM sent at 02/16/2021  4:37 PM EDT ----- Misty Stanley- please let her know that the MRI shoes some mild tendonitis on the side of the ankle and also a ganglion cyst on the top of the foot. At this time I would recommend starting PT back. Can you see where she would like to go for this? Thanks.

## 2021-08-30 ENCOUNTER — Encounter: Payer: Self-pay | Admitting: Podiatry

## 2021-08-30 ENCOUNTER — Ambulatory Visit (INDEPENDENT_AMBULATORY_CARE_PROVIDER_SITE_OTHER): Payer: BC Managed Care – PPO | Admitting: Podiatry

## 2021-08-30 ENCOUNTER — Other Ambulatory Visit: Payer: Self-pay

## 2021-08-30 DIAGNOSIS — M722 Plantar fascial fibromatosis: Secondary | ICD-10-CM

## 2021-08-30 MED ORDER — MELOXICAM 15 MG PO TABS: 15.0000 mg | ORAL_TABLET | Freq: Every day | ORAL | 0 refills | Status: DC

## 2021-08-30 MED ORDER — KETOCONAZOLE 2 % EX CREA: 1.0000 "application " | TOPICAL_CREAM | Freq: Every day | CUTANEOUS | 0 refills | Status: AC

## 2021-09-02 NOTE — Progress Notes (Signed)
Subjective: 45 year old female presents the office today for concerns of recurrent foot pain mostly to the heels.  She states that she stands all day at work she wears steel toed shoes which does not help.  No recent injury or trauma that she reports.  No radiating pain.  No other concerns.  She is asking about updating FMLA for her to sit.  Objective: AAO x3, NAD DP/PT pulses palpable bilaterally, CRT less than 3 seconds Along bilateral feet there is tenderness palpation on plantar medial tubercle of the calcaneus and insertion of plantar fascia.  She does get some discomfort of the plantar fascia within the arch of the foot as well.  No area pinpoint tenderness.  MMT 5/5. No pain with calf compression, swelling, warmth, erythema  Assessment: 45 year old female plantar fasciitis; chronic foot pain  Plan: -All treatment options discussed with the patient including all alternatives, risks, complications.  -Steroid injection performed bilaterally.  See procedure note below. -Prescribed mobic. Discussed side effects of the medication and directed to stop if any are to occur and call the office.  -Stretching, icing daily.  Discussed shoes and good arch support. -We will update FMLA.  Procedure: Injection Tendon/Ligament Discussed alternatives, risks, complications and verbal consent was obtained.  Location: Bilateral plantar fascia at the glabrous junction; medial approach. Skin Prep: Alcohol. Injectate: 0.5cc 0.5% marcaine plain, 0.5 cc 2% lidocaine plain and, 1 cc kenalog 10. Disposition: Patient tolerated procedure well. Injection site dressed with a band-aid.  Post-injection care was discussed and return precautions discussed.    Vivi Barrack DPM

## 2021-09-30 ENCOUNTER — Other Ambulatory Visit: Payer: Self-pay | Admitting: Podiatry

## 2021-09-30 NOTE — Telephone Encounter (Signed)
Please Advise

## 2021-10-13 ENCOUNTER — Emergency Department (HOSPITAL_BASED_OUTPATIENT_CLINIC_OR_DEPARTMENT_OTHER)
Admission: EM | Admit: 2021-10-13 | Discharge: 2021-10-13 | Discharge status: Against Medical Advice | Payer: BC Managed Care – PPO | Attending: Emergency Medicine | Admitting: Emergency Medicine

## 2021-10-13 ENCOUNTER — Encounter (HOSPITAL_BASED_OUTPATIENT_CLINIC_OR_DEPARTMENT_OTHER): Payer: Self-pay | Admitting: Obstetrics and Gynecology

## 2021-10-13 ENCOUNTER — Other Ambulatory Visit: Payer: Self-pay

## 2021-10-13 DIAGNOSIS — Z5321 Procedure and treatment not carried out due to patient leaving prior to being seen by health care provider: Secondary | ICD-10-CM | POA: Diagnosis not present

## 2021-10-13 DIAGNOSIS — N6002 Solitary cyst of left breast: Secondary | ICD-10-CM | POA: Insufficient documentation

## 2021-10-13 LAB — CBC WITH DIFFERENTIAL/PLATELET
Abs Immature Granulocytes: 0.03 10*3/uL (ref 0.00–0.07)
Basophils Absolute: 0 10*3/uL (ref 0.0–0.1)
Basophils Relative: 0 %
Eosinophils Absolute: 0.1 10*3/uL (ref 0.0–0.5)
Eosinophils Relative: 1 %
HCT: 38 % (ref 36.0–46.0)
Hemoglobin: 12.7 g/dL (ref 12.0–15.0)
Immature Granulocytes: 0 %
Lymphocytes Relative: 31 %
Lymphs Abs: 2.6 10*3/uL (ref 0.7–4.0)
MCH: 29.4 pg (ref 26.0–34.0)
MCHC: 33.4 g/dL (ref 30.0–36.0)
MCV: 88 fL (ref 80.0–100.0)
Monocytes Absolute: 0.6 10*3/uL (ref 0.1–1.0)
Monocytes Relative: 7 %
Neutro Abs: 5.1 10*3/uL (ref 1.7–7.7)
Neutrophils Relative %: 61 %
Platelets: 359 10*3/uL (ref 150–400)
RBC: 4.32 MIL/uL (ref 3.87–5.11)
RDW: 12.4 % (ref 11.5–15.5)
WBC: 8.5 10*3/uL (ref 4.0–10.5)
nRBC: 0 % (ref 0.0–0.2)

## 2021-10-13 LAB — COMPREHENSIVE METABOLIC PANEL
ALT: 9 U/L (ref 0–44)
AST: 11 U/L — ABNORMAL LOW (ref 15–41)
Albumin: 4.3 g/dL (ref 3.5–5.0)
Alkaline Phosphatase: 56 U/L (ref 38–126)
Anion gap: 9 (ref 5–15)
BUN: 10 mg/dL (ref 6–20)
CO2: 24 mmol/L (ref 22–32)
Calcium: 8.5 mg/dL — ABNORMAL LOW (ref 8.9–10.3)
Chloride: 105 mmol/L (ref 98–111)
Creatinine, Ser: 0.73 mg/dL (ref 0.44–1.00)
GFR, Estimated: >60 mL/min (ref >60)
Glucose, Bld: 210 mg/dL — ABNORMAL HIGH (ref 70–99)
Potassium: 3.5 mmol/L (ref 3.5–5.1)
Sodium: 138 mmol/L (ref 135–145)
Total Bilirubin: 0.5 mg/dL (ref 0.3–1.2)
Total Protein: 7.1 g/dL (ref 6.5–8.1)

## 2021-10-13 LAB — LACTIC ACID, PLASMA: Lactic Acid, Venous: 2.3 mmol/L (ref 0.5–1.9)

## 2021-10-13 MED ORDER — LIDOCAINE HCL (PF) 1 % IJ SOLN
INTRAMUSCULAR | Status: AC
  Filled 2021-10-13: qty 5

## 2021-10-13 MED ORDER — LIDOCAINE-EPINEPHRINE (PF) 2 %-1:200000 IJ SOLN
INTRAMUSCULAR | Status: AC
  Filled 2021-10-13: qty 20

## 2021-10-13 NOTE — ED Triage Notes (Signed)
Patient reports to the ER for a cyst on her left breast. Patient states it has been there for a while but has never before been infected. Patient reports noticing it becoming more inflamed since Sunday.

## 2021-10-16 ENCOUNTER — Other Ambulatory Visit: Payer: Self-pay

## 2021-10-16 ENCOUNTER — Emergency Department (HOSPITAL_BASED_OUTPATIENT_CLINIC_OR_DEPARTMENT_OTHER)
Admission: EM | Admit: 2021-10-16 | Discharge: 2021-10-16 | Disposition: A | Discharge status: Home / Self Care | Payer: BC Managed Care – PPO | Attending: Emergency Medicine | Admitting: Emergency Medicine

## 2021-10-16 ENCOUNTER — Encounter (HOSPITAL_BASED_OUTPATIENT_CLINIC_OR_DEPARTMENT_OTHER): Payer: Self-pay

## 2021-10-16 DIAGNOSIS — N63 Unspecified lump in unspecified breast: Secondary | ICD-10-CM | POA: Insufficient documentation

## 2021-10-16 DIAGNOSIS — N6321 Unspecified lump in the left breast, upper outer quadrant: Secondary | ICD-10-CM

## 2021-10-16 MED ORDER — CEPHALEXIN 500 MG PO CAPS: 500.0000 mg | ORAL_CAPSULE | Freq: Four times a day (QID) | ORAL | 0 refills | Status: AC

## 2021-10-16 NOTE — ED Notes (Signed)
Dressing applied to left breast abscess. Red wound bed, dressed with antibiotic ointment and mepilex. Reviewed wound care and s:s of infection .

## 2021-10-16 NOTE — ED Provider Notes (Signed)
[facility] EMERGENCY DEPT Provider Note   CSN: [id number] Arrival date & time: 10/16/21  0017     History Chief Complaint  Patient presents with   Abscess    Olivia Abbott is a 45 y.o. female.  HPI 51 female no signet past medical history presents today complaining of breast swelling and discharge.  States that she thought she had a cyst in her left breast approximately 6 days ago.  It began swelling.  She came to be seen but left because the ED was too busy.  Yesterday she began having some puslike discharge from the area.  She thinks that she has abscesses drained.  She has a family history of breast cancer with her mother having breast cancer in 2008 at the age of 70.  Patient denies any fever, chills, pain up into the axilla, history of breast masses.  She states she has had normal mammograms but has not had one in the past recent past due to COVID.  She is scheduled to have one soon.  Her primary care doctor is Dr. Parke Simmers.     Past Medical History:  Diagnosis Date   No pertinent past medical history     Patient Active Problem List   Diagnosis Date Noted   Postpartum state 11/03/2011    Past Surgical History:  Procedure Laterality Date   NO PAST SURGERIES       OB History     Gravida  3   Para  3   Term  3   Preterm  0   AB  0   Living  3      SAB  0   IAB  0   Ectopic  0   Multiple  0   Live Births  3           Family History  Problem Relation Age of Onset   Diabetes Father    Cancer Maternal Aunt        colon   Diabetes Maternal Aunt    Diabetes Maternal Uncle    Diabetes Paternal Uncle    Diabetes Maternal Grandmother    Cancer Maternal Grandfather        prostrate   Diabetes Paternal Grandfather    Anesthesia problems Neg Hx    Hypotension Neg Hx    Malignant hyperthermia Neg Hx    Pseudochol deficiency Neg Hx     Social History   Tobacco Use   Smoking status: Never   Smokeless tobacco: Never  Vaping Use    Vaping Use: Never used  Substance Use Topics   Alcohol use: Yes    Comment: Social   Drug use: No    Home Medications Prior to Admission medications   Medication Sig Start Date End Date Taking? Authorizing Provider  cephALEXin (KEFLEX) 500 MG capsule Take 1 capsule (500 mg total) by mouth 4 (four) times daily. 10/16/21  Yes Margarita Grizzle, MD  ketoconazole (NIZORAL) 2 % cream Apply 1 application topically daily. 07/01/20   Vivi Barrack, DPM  ketoconazole (NIZORAL) 2 % cream Apply 1 application topically daily. 08/30/21   Vivi Barrack, DPM  meloxicam (MOBIC) 15 MG tablet TAKE 1 TABLET (15 MG TOTAL) BY MOUTH DAILY. 09/30/21 09/30/22  Vivi Barrack, DPM  methocarbamol (ROBAXIN) 500 MG tablet Take 1 tablet (500 mg total) by mouth at bedtime and may repeat dose one time if needed. 09/04/19   Bethel Born, PA-C  methylPREDNISolone (MEDROL DOSEPAK) 4 MG TBPK tablet  Take as directed 04/25/19   Vivi Barrack, DPM  methylPREDNISolone (MEDROL DOSEPAK) 4 MG TBPK tablet Take as directed 07/01/20   Vivi Barrack, DPM  metroNIDAZOLE (FLAGYL) 500 MG tablet Take 500 mg by mouth 2 (two) times daily. 01/13/20   [provider]  terbinafine (LAMISIL) 250 MG tablet Take 1 tablet (250 mg total) by mouth daily. 05/05/19   Vivi Barrack, DPM    Allergies    Patient has no known allergies.  Review of Systems   Review of Systems  All other systems reviewed and are negative.  Physical Exam Updated Vital Signs BP 125/79 (BP Location: Left Arm)   Pulse 90   Temp 99 F (37.2 C) (Oral)   Resp 15   SpO2 100%   Physical Exam Vitals and nursing note reviewed.  Constitutional:      General: She is not in acute distress.    Appearance: She is well-developed.  HENT:     Head: Normocephalic and atraumatic.     Right Ear: External ear normal.     Left Ear: External ear normal.     Nose: Nose normal.  Eyes:     Conjunctiva/sclera: Conjunctivae normal.     Pupils:  Pupils are equal, round, and reactive to light.  Cardiovascular:     Rate and Rhythm: Normal rate.  Pulmonary:     Effort: Pulmonary effort is normal.  Chest:     Comments: Left breast with approximately 3 x 2 cm firm nodule type lesion with skin breakdown and discharge area is moderately tender Abdominal:     Palpations: Abdomen is soft.  Musculoskeletal:        General: Normal range of motion.     Cervical back: Normal range of motion and neck supple.  Skin:    General: Skin is warm and dry.  Neurological:     Mental Status: She is alert and oriented to person, place, and time.     Motor: No abnormal muscle tone.     Coordination: Coordination normal.  Psychiatric:        Behavior: Behavior normal.        Thought Content: Thought content normal.    ED Results / Procedures / Treatments   Labs (all labs ordered are listed, but only abnormal results are displayed) Labs Reviewed - No data to display  EKG None  Radiology No results found.  Procedures Procedures  EMERGENCY DEPARTMENT US SOFT TISSUE INTERPRETATION "Study: Limited Soft Tissue Ultrasound"  INDICATIONS: Soft tissue infection Multiple views of the body part were obtained in real-time with a multi-frequency linear probe  PERFORMED BY: Myself IMAGES ARCHIVED?: Yes SIDE:Left BODY PART:Breast INTERPRETATION:   One-point 2 x 2 centimeter left nodular lesion with some surrounding fluid    Medications Ordered in ED Medications - No data to display  ED Course  I have reviewed the triage vital signs and the nursing notes.  Pertinent labs & imaging results that were available during my care of the patient were reviewed by me and considered in my medical decision making (see chart for details).    MDM Rules/Calculators/A&P                           Patient with left breast mass and skin breakdown with drainage.  Point-of-care ultrasound does not show definitive isolated abscess.  It is suspicious for a  lobular mass with some surrounding fluid.  Plan antibiotics, warm compresses, referral  for mammography, ultrasound, biopsy.  Patient was instructed to call her primary care doctor tomorrow to assure that all processes are in place in the most expeditious manner.  Will give prescription for Keflex. Final Clinical Impression(s) / ED Diagnoses Final diagnoses:  Mass of upper outer quadrant of left breast    Rx / DC Orders ED Discharge Orders          Ordered    cephALEXin (KEFLEX) 500 MG capsule  4 times daily        10/16/21 1015             Margarita Grizzle, MD 10/16/21 1016

## 2021-10-16 NOTE — ED Triage Notes (Signed)
She states she has an abscess near left axilla x ~ 1 week. She states "Last night it busted" [sic]. She is in no distress.

## 2021-10-16 NOTE — ED Notes (Signed)
Dc instructions reviewed with patient. Patient voiced understanding. Dc with belongings.  °

## 2021-10-16 NOTE — Discharge Instructions (Signed)
Please take all antibiotics as prescribed Use warm compresses to encourage drainage Call the breast center tomorrow to have mammogram and ultrasound soon as possible Please follow-up with Dr. Parke Simmers tomorrow

## 2021-11-12 ENCOUNTER — Other Ambulatory Visit: Payer: Self-pay | Admitting: Podiatry

## 2021-11-13 NOTE — Telephone Encounter (Signed)
Please advise 

## 2022-01-27 ENCOUNTER — Other Ambulatory Visit: Payer: Self-pay | Admitting: Family

## 2022-02-27 ENCOUNTER — Ambulatory Visit: Payer: BC Managed Care – PPO | Admitting: Podiatry

## 2022-03-13 ENCOUNTER — Ambulatory Visit: Payer: BC Managed Care – PPO | Admitting: Podiatry

## 2022-03-13 DIAGNOSIS — M722 Plantar fascial fibromatosis: Secondary | ICD-10-CM

## 2022-03-13 MED ORDER — MELOXICAM 15 MG PO TABS: 15.0000 mg | ORAL_TABLET | Freq: Every day | ORAL | 0 refills | Status: DC

## 2022-03-13 MED ORDER — CICLOPIROX 0.77 % EX GEL: 1.0000 "application " | Freq: Two times a day (BID) | CUTANEOUS | 0 refills | Status: AC

## 2022-03-15 DIAGNOSIS — M722 Plantar fascial fibromatosis: Secondary | ICD-10-CM | POA: Insufficient documentation

## 2022-03-15 NOTE — Progress Notes (Signed)
Subjective: ?46 year old female presents the office today with concerns of bilateral foot pain.  States that the pain that she was being seen previously started to come back.  She started doing more standing 11 hours a day which is aggravating her symptoms.  She is asked for different treatment options but also for FMLA restrictions at work. ?She denies any recent injury or trauma.  No radiating pain. ? ?Objective: ?AAO x3, NAD ?DP/PT pulses palpable bilaterally, CRT less than 3 seconds ?There is tenderness palpation on the plantar medial tubercle of the calcaneus at the insertion of plantar fascia bilaterally.  Mild discomfort the arch of the foot along the plantar fascia.  There is no area of pinpoint tenderness. Flexor, extensor tendons appear to be intact.  MMT 5/5.  No Tinel sign. ?No pain with calf compression, swelling, warmth, erythema ? ?Assessment: ?Plantar fasciitis reoccurrence ? ?Plan: ?-All treatment options discussed with the patient including all alternatives, risks, complications.  ?-Prescribed mobic. Discussed side effects of the medication and directed to stop if any are to occur and call the office.  ?-PT referral ?-Discussed stretching, icing on a regular basis ?-Discussed shoe modifications and arch supports.  She does not follow-up with [facility] for orthotics. ?-Patient encouraged to call the office with any questions, concerns, change in symptoms.  ? ?Vivi Barrack DPM ? ?

## 2022-04-19 ENCOUNTER — Other Ambulatory Visit: Payer: Self-pay | Admitting: Podiatry

## 2022-04-24 ENCOUNTER — Ambulatory Visit: Payer: BC Managed Care – PPO | Admitting: Podiatry

## 2023-03-26 ENCOUNTER — Other Ambulatory Visit: Payer: Self-pay | Admitting: Radiology

## 2023-09-17 ENCOUNTER — Other Ambulatory Visit: Payer: Self-pay

## 2023-09-17 DIAGNOSIS — Z1211 Encounter for screening for malignant neoplasm of colon: Secondary | ICD-10-CM

## 2023-09-21 ENCOUNTER — Ambulatory Visit: Payer: BC Managed Care – PPO

## 2024-12-25 ENCOUNTER — Other Ambulatory Visit (HOSPITAL_BASED_OUTPATIENT_CLINIC_OR_DEPARTMENT_OTHER): Payer: Self-pay

## 2024-12-25 ENCOUNTER — Encounter (HOSPITAL_BASED_OUTPATIENT_CLINIC_OR_DEPARTMENT_OTHER): Payer: Self-pay | Admitting: Pharmacist

## 2024-12-25 MED ORDER — WEGOVY 1.5 MG PO TABS
1.5000 mg | ORAL_TABLET | Freq: Every day | ORAL | 1 refills | Status: AC
  Filled 2024-12-25: qty 30, 30d supply, fill #0

## 2025-01-05 ENCOUNTER — Other Ambulatory Visit (HOSPITAL_BASED_OUTPATIENT_CLINIC_OR_DEPARTMENT_OTHER): Payer: Self-pay
